# Patient Record
Sex: Female | Born: 1935 | Race: White | Hispanic: No | Smoking: Former smoker
Health system: Southern US, Community
[De-identification: ages and names within clinical notes are randomized; demographics above are authoritative.]

## PROBLEM LIST (undated history)

## (undated) DIAGNOSIS — F0280 Dementia in other diseases classified elsewhere without behavioral disturbance: Secondary | ICD-10-CM

## (undated) DIAGNOSIS — Z8719 Personal history of other diseases of the digestive system: Secondary | ICD-10-CM

## (undated) DIAGNOSIS — G309 Alzheimer's disease, unspecified: Secondary | ICD-10-CM

## (undated) DIAGNOSIS — M4807 Spinal stenosis, lumbosacral region: Secondary | ICD-10-CM

## (undated) DIAGNOSIS — E785 Hyperlipidemia, unspecified: Secondary | ICD-10-CM

## (undated) DIAGNOSIS — C50919 Malignant neoplasm of unspecified site of unspecified female breast: Secondary | ICD-10-CM

## (undated) DIAGNOSIS — E039 Hypothyroidism, unspecified: Secondary | ICD-10-CM

## (undated) DIAGNOSIS — G43909 Migraine, unspecified, not intractable, without status migrainosus: Secondary | ICD-10-CM

## (undated) DIAGNOSIS — F0390 Unspecified dementia without behavioral disturbance: Secondary | ICD-10-CM

## (undated) DIAGNOSIS — J449 Chronic obstructive pulmonary disease, unspecified: Secondary | ICD-10-CM

## (undated) DIAGNOSIS — I10 Essential (primary) hypertension: Secondary | ICD-10-CM

## (undated) DIAGNOSIS — Z8709 Personal history of other diseases of the respiratory system: Secondary | ICD-10-CM

## (undated) DIAGNOSIS — E559 Vitamin D deficiency, unspecified: Secondary | ICD-10-CM

## (undated) HISTORY — DX: Vitamin D deficiency, unspecified: E55.9

## (undated) HISTORY — DX: Spinal stenosis, lumbosacral region: M48.07

## (undated) HISTORY — DX: Alzheimer's disease, unspecified: G30.9

## (undated) HISTORY — DX: Essential (primary) hypertension: I10

## (undated) HISTORY — DX: Unspecified dementia, unspecified severity, without behavioral disturbance, psychotic disturbance, mood disturbance, and anxiety: F03.90

## (undated) HISTORY — DX: Personal history of other diseases of the respiratory system: Z87.09

## (undated) HISTORY — PX: MASTECTOMY: SHX3

## (undated) HISTORY — DX: Dementia in other diseases classified elsewhere, unspecified severity, without behavioral disturbance, psychotic disturbance, mood disturbance, and anxiety: F02.80

## (undated) HISTORY — PX: CAROTID ENDARTERECTOMY: SUR193

## (undated) HISTORY — DX: Hypothyroidism, unspecified: E03.9

## (undated) HISTORY — PX: CATARACT EXTRACTION: SUR2

## (undated) HISTORY — DX: Personal history of other diseases of the digestive system: Z87.19

## (undated) HISTORY — PX: BREAST LUMPECTOMY: SHX2

## (undated) HISTORY — DX: Migraine, unspecified, not intractable, without status migrainosus: G43.909

## (undated) HISTORY — PX: ABDOMINAL HYSTERECTOMY: SHX81

## (undated) HISTORY — PX: BACK SURGERY: SHX140

## (undated) HISTORY — DX: Hyperlipidemia, unspecified: E78.5

## (undated) HISTORY — DX: Malignant neoplasm of unspecified site of unspecified female breast: C50.919

---

## 1999-07-28 ENCOUNTER — Encounter: Admission: RE | Admit: 1999-07-28 | Discharge: 1999-10-26 | Payer: Self-pay | Admitting: Anesthesiology

## 2001-07-04 ENCOUNTER — Encounter: Admission: RE | Admit: 2001-07-04 | Discharge: 2001-07-04 | Payer: Self-pay | Admitting: Orthopedic Surgery

## 2001-07-04 ENCOUNTER — Encounter: Payer: Self-pay | Admitting: Orthopedic Surgery

## 2002-01-09 ENCOUNTER — Inpatient Hospital Stay (HOSPITAL_COMMUNITY): Admission: RE | Admit: 2002-01-09 | Discharge: 2002-01-14 | Payer: Self-pay | Admitting: Orthopedic Surgery

## 2002-01-09 ENCOUNTER — Encounter: Payer: Self-pay | Admitting: Orthopedic Surgery

## 2002-05-07 ENCOUNTER — Encounter: Admission: RE | Admit: 2002-05-07 | Discharge: 2002-05-07 | Payer: Self-pay | Admitting: Family Medicine

## 2002-05-07 ENCOUNTER — Encounter: Payer: Self-pay | Admitting: Family Medicine

## 2002-05-08 ENCOUNTER — Encounter: Payer: Self-pay | Admitting: Family Medicine

## 2002-05-08 ENCOUNTER — Encounter: Admission: RE | Admit: 2002-05-08 | Discharge: 2002-05-08 | Payer: Self-pay | Admitting: Family Medicine

## 2002-05-11 ENCOUNTER — Emergency Department (HOSPITAL_COMMUNITY): Admission: EM | Admit: 2002-05-11 | Discharge: 2002-05-11 | Payer: Self-pay | Admitting: Emergency Medicine

## 2002-05-11 ENCOUNTER — Encounter: Payer: Self-pay | Admitting: Emergency Medicine

## 2002-06-20 ENCOUNTER — Encounter: Admission: RE | Admit: 2002-06-20 | Discharge: 2002-06-20 | Payer: Self-pay | Admitting: Surgery

## 2002-06-20 ENCOUNTER — Encounter: Payer: Self-pay | Admitting: Surgery

## 2002-06-21 ENCOUNTER — Other Ambulatory Visit: Admission: RE | Admit: 2002-06-21 | Discharge: 2002-06-21 | Payer: Self-pay | Admitting: Diagnostic Radiology

## 2002-06-21 ENCOUNTER — Encounter: Payer: Self-pay | Admitting: Surgery

## 2002-06-21 ENCOUNTER — Encounter (INDEPENDENT_AMBULATORY_CARE_PROVIDER_SITE_OTHER): Payer: Self-pay | Admitting: *Deleted

## 2002-06-21 ENCOUNTER — Encounter: Admission: RE | Admit: 2002-06-21 | Discharge: 2002-06-21 | Payer: Self-pay | Admitting: Surgery

## 2002-07-15 ENCOUNTER — Encounter (INDEPENDENT_AMBULATORY_CARE_PROVIDER_SITE_OTHER): Payer: Self-pay | Admitting: Specialist

## 2002-07-15 ENCOUNTER — Inpatient Hospital Stay (HOSPITAL_COMMUNITY): Admission: RE | Admit: 2002-07-15 | Discharge: 2002-07-16 | Payer: Self-pay | Admitting: Surgery

## 2002-08-07 ENCOUNTER — Encounter: Payer: Self-pay | Admitting: Surgery

## 2002-08-07 ENCOUNTER — Ambulatory Visit (HOSPITAL_BASED_OUTPATIENT_CLINIC_OR_DEPARTMENT_OTHER): Admission: RE | Admit: 2002-08-07 | Discharge: 2002-08-07 | Payer: Self-pay | Admitting: Surgery

## 2002-12-04 ENCOUNTER — Ambulatory Visit (HOSPITAL_BASED_OUTPATIENT_CLINIC_OR_DEPARTMENT_OTHER): Admission: RE | Admit: 2002-12-04 | Discharge: 2002-12-04 | Payer: Self-pay | Admitting: Surgery

## 2003-01-09 ENCOUNTER — Encounter: Payer: Self-pay | Admitting: Family Medicine

## 2003-01-09 ENCOUNTER — Encounter: Admission: RE | Admit: 2003-01-09 | Discharge: 2003-01-09 | Payer: Self-pay | Admitting: Family Medicine

## 2003-01-31 ENCOUNTER — Encounter: Admission: RE | Admit: 2003-01-31 | Discharge: 2003-03-25 | Payer: Self-pay | Admitting: Surgery

## 2003-04-15 ENCOUNTER — Ambulatory Visit (HOSPITAL_COMMUNITY): Admission: RE | Admit: 2003-04-15 | Discharge: 2003-04-15 | Payer: Self-pay | Admitting: Hematology & Oncology

## 2003-04-15 ENCOUNTER — Encounter: Payer: Self-pay | Admitting: Hematology & Oncology

## 2003-09-28 ENCOUNTER — Emergency Department (HOSPITAL_COMMUNITY): Admission: EM | Admit: 2003-09-28 | Discharge: 2003-09-28 | Payer: Self-pay | Admitting: Emergency Medicine

## 2004-01-16 ENCOUNTER — Emergency Department (HOSPITAL_COMMUNITY): Admission: EM | Admit: 2004-01-16 | Discharge: 2004-01-16 | Payer: Self-pay | Admitting: Emergency Medicine

## 2004-01-29 ENCOUNTER — Encounter: Admission: RE | Admit: 2004-01-29 | Discharge: 2004-01-29 | Payer: Self-pay | Admitting: Orthopedic Surgery

## 2004-09-21 ENCOUNTER — Ambulatory Visit: Payer: Self-pay | Admitting: Family Medicine

## 2004-10-12 ENCOUNTER — Ambulatory Visit: Payer: Self-pay | Admitting: Hematology & Oncology

## 2004-10-13 ENCOUNTER — Ambulatory Visit (HOSPITAL_COMMUNITY): Admission: RE | Admit: 2004-10-13 | Discharge: 2004-10-13 | Payer: Self-pay | Admitting: Hematology & Oncology

## 2004-11-01 ENCOUNTER — Ambulatory Visit: Payer: Self-pay | Admitting: Internal Medicine

## 2004-11-04 ENCOUNTER — Ambulatory Visit: Payer: Self-pay | Admitting: Family Medicine

## 2004-11-16 ENCOUNTER — Ambulatory Visit: Payer: Self-pay | Admitting: Family Medicine

## 2004-11-29 ENCOUNTER — Ambulatory Visit (HOSPITAL_COMMUNITY): Admission: RE | Admit: 2004-11-29 | Discharge: 2004-11-29 | Payer: Self-pay | Admitting: Family Medicine

## 2004-12-08 ENCOUNTER — Ambulatory Visit: Payer: Self-pay | Admitting: Family Medicine

## 2004-12-09 ENCOUNTER — Encounter: Admission: RE | Admit: 2004-12-09 | Discharge: 2004-12-09 | Payer: Self-pay | Admitting: Family Medicine

## 2004-12-23 ENCOUNTER — Ambulatory Visit: Payer: Self-pay | Admitting: Family Medicine

## 2005-01-11 ENCOUNTER — Ambulatory Visit: Payer: Self-pay | Admitting: Hematology & Oncology

## 2005-01-19 ENCOUNTER — Ambulatory Visit: Payer: Self-pay | Admitting: Family Medicine

## 2005-02-07 ENCOUNTER — Encounter: Admission: RE | Admit: 2005-02-07 | Discharge: 2005-02-07 | Payer: Self-pay | Admitting: Orthopedic Surgery

## 2005-04-15 ENCOUNTER — Ambulatory Visit: Payer: Self-pay | Admitting: Internal Medicine

## 2005-05-04 ENCOUNTER — Ambulatory Visit: Payer: Self-pay | Admitting: Internal Medicine

## 2005-05-09 ENCOUNTER — Ambulatory Visit: Payer: Self-pay | Admitting: Hematology & Oncology

## 2005-07-29 ENCOUNTER — Ambulatory Visit: Payer: Self-pay | Admitting: Family Medicine

## 2005-08-23 ENCOUNTER — Ambulatory Visit: Payer: Self-pay | Admitting: Family Medicine

## 2005-08-29 ENCOUNTER — Ambulatory Visit: Payer: Self-pay | Admitting: Family Medicine

## 2005-09-21 ENCOUNTER — Ambulatory Visit: Payer: Self-pay | Admitting: Cardiology

## 2005-09-26 ENCOUNTER — Ambulatory Visit: Payer: Self-pay

## 2005-10-13 ENCOUNTER — Ambulatory Visit: Payer: Self-pay | Admitting: Internal Medicine

## 2005-10-13 ENCOUNTER — Inpatient Hospital Stay (HOSPITAL_COMMUNITY): Admission: EM | Admit: 2005-10-13 | Discharge: 2005-10-15 | Payer: Self-pay | Admitting: Emergency Medicine

## 2005-10-14 ENCOUNTER — Ambulatory Visit: Payer: Self-pay | Admitting: Internal Medicine

## 2005-11-08 ENCOUNTER — Ambulatory Visit: Payer: Self-pay | Admitting: Internal Medicine

## 2005-11-18 ENCOUNTER — Ambulatory Visit: Payer: Self-pay | Admitting: Hematology & Oncology

## 2005-11-28 ENCOUNTER — Encounter: Admission: RE | Admit: 2005-11-28 | Discharge: 2005-11-28 | Payer: Self-pay | Admitting: Internal Medicine

## 2005-12-21 ENCOUNTER — Ambulatory Visit: Payer: Self-pay | Admitting: Internal Medicine

## 2006-01-03 ENCOUNTER — Ambulatory Visit: Payer: Self-pay | Admitting: Family Medicine

## 2006-01-13 ENCOUNTER — Ambulatory Visit: Payer: Self-pay | Admitting: Family Medicine

## 2006-01-19 ENCOUNTER — Ambulatory Visit: Payer: Self-pay | Admitting: Family Medicine

## 2006-01-26 ENCOUNTER — Ambulatory Visit: Payer: Self-pay | Admitting: Family Medicine

## 2006-03-07 ENCOUNTER — Ambulatory Visit: Payer: Self-pay | Admitting: Family Medicine

## 2006-04-07 ENCOUNTER — Ambulatory Visit: Payer: Self-pay | Admitting: Family Medicine

## 2006-05-16 ENCOUNTER — Ambulatory Visit: Payer: Self-pay | Admitting: Hematology & Oncology

## 2006-05-22 LAB — CBC WITH DIFFERENTIAL/PLATELET
Basophils Absolute: 0 10*3/uL (ref 0.0–0.1)
Eosinophils Absolute: 0 10*3/uL (ref 0.0–0.5)
HCT: 39.9 % (ref 34.8–46.6)
HGB: 13.7 g/dL (ref 11.6–15.9)
MCH: 29.4 pg (ref 26.0–34.0)
MONO#: 0.7 10*3/uL (ref 0.1–0.9)
NEUT#: 3.3 10*3/uL (ref 1.5–6.5)
NEUT%: 63 % (ref 39.6–76.8)
RDW: 13.2 % (ref 11.3–14.5)
lymph#: 1.2 10*3/uL (ref 0.9–3.3)

## 2006-05-22 LAB — COMPREHENSIVE METABOLIC PANEL
AST: 22 U/L (ref 0–37)
Albumin: 4.6 g/dL (ref 3.5–5.2)
BUN: 15 mg/dL (ref 6–23)
CO2: 27 mEq/L (ref 19–32)
Calcium: 9.7 mg/dL (ref 8.4–10.5)
Chloride: 104 mEq/L (ref 96–112)
Creatinine, Ser: 0.71 mg/dL (ref 0.40–1.20)
Glucose, Bld: 93 mg/dL (ref 70–99)
Potassium: 3.8 mEq/L (ref 3.5–5.3)

## 2006-09-07 ENCOUNTER — Ambulatory Visit: Payer: Self-pay | Admitting: Cardiology

## 2006-09-30 ENCOUNTER — Encounter: Admission: RE | Admit: 2006-09-30 | Discharge: 2006-09-30 | Payer: Self-pay | Admitting: Neurology

## 2006-11-09 ENCOUNTER — Ambulatory Visit: Payer: Self-pay | Admitting: Family Medicine

## 2006-11-15 ENCOUNTER — Ambulatory Visit: Payer: Self-pay | Admitting: Family Medicine

## 2006-11-15 LAB — CONVERTED CEMR LAB
AST: 30 units/L (ref 0–37)
Calcium: 9.9 mg/dL (ref 8.4–10.5)
Chloride: 104 meq/L (ref 96–112)
Creatinine, Ser: 1 mg/dL (ref 0.4–1.2)
Free T4: 1 ng/dL (ref 0.6–1.6)
Glomerular Filtration Rate, Af Am: 69 mL/min/{1.73_m2}
Potassium: 4.3 meq/L (ref 3.5–5.1)
Sodium: 142 meq/L (ref 135–145)
T3, Free: 2.5 pg/mL (ref 2.3–4.2)
TSH: 4.91 microintl units/mL (ref 0.35–5.50)

## 2006-11-17 ENCOUNTER — Ambulatory Visit: Payer: Self-pay | Admitting: Hematology & Oncology

## 2006-11-22 LAB — COMPREHENSIVE METABOLIC PANEL
ALT: 13 U/L (ref 0–35)
AST: 17 U/L (ref 0–37)
CO2: 25 mEq/L (ref 19–32)
Calcium: 9.5 mg/dL (ref 8.4–10.5)
Chloride: 108 mEq/L (ref 96–112)
Creatinine, Ser: 0.75 mg/dL (ref 0.40–1.20)
Potassium: 3.8 mEq/L (ref 3.5–5.3)
Sodium: 142 mEq/L (ref 135–145)
Total Protein: 6.5 g/dL (ref 6.0–8.3)

## 2006-12-26 DIAGNOSIS — E039 Hypothyroidism, unspecified: Secondary | ICD-10-CM | POA: Insufficient documentation

## 2006-12-26 DIAGNOSIS — J4489 Other specified chronic obstructive pulmonary disease: Secondary | ICD-10-CM | POA: Insufficient documentation

## 2006-12-26 DIAGNOSIS — J45909 Unspecified asthma, uncomplicated: Secondary | ICD-10-CM | POA: Insufficient documentation

## 2006-12-26 DIAGNOSIS — E785 Hyperlipidemia, unspecified: Secondary | ICD-10-CM

## 2006-12-26 DIAGNOSIS — I10 Essential (primary) hypertension: Secondary | ICD-10-CM | POA: Insufficient documentation

## 2006-12-26 DIAGNOSIS — Z853 Personal history of malignant neoplasm of breast: Secondary | ICD-10-CM

## 2006-12-26 DIAGNOSIS — M545 Low back pain: Secondary | ICD-10-CM

## 2006-12-26 DIAGNOSIS — J449 Chronic obstructive pulmonary disease, unspecified: Secondary | ICD-10-CM

## 2007-01-23 ENCOUNTER — Ambulatory Visit: Payer: Self-pay | Admitting: Family Medicine

## 2007-01-23 LAB — CONVERTED CEMR LAB
ALT: 17 units/L (ref 0–40)
AST: 25 units/L (ref 0–37)
Albumin: 4.1 g/dL (ref 3.5–5.2)
Alkaline Phosphatase: 47 units/L (ref 39–117)
BUN: 12 mg/dL (ref 6–23)
Basophils Absolute: 0 10*3/uL (ref 0.0–0.1)
Basophils Relative: 0.7 % (ref 0.0–1.0)
CO2: 26 meq/L (ref 19–32)
Calcium: 9.5 mg/dL (ref 8.4–10.5)
Chloride: 106 meq/L (ref 96–112)
Creatinine, Ser: 0.7 mg/dL (ref 0.4–1.2)
Hemoglobin: 14.8 g/dL (ref 12.0–15.0)
MCHC: 34.6 g/dL (ref 30.0–36.0)
Monocytes Absolute: 0.6 10*3/uL (ref 0.2–0.7)
Monocytes Relative: 12.7 % — ABNORMAL HIGH (ref 3.0–11.0)
Platelets: 231 10*3/uL (ref 150–400)
Potassium: 4 meq/L (ref 3.5–5.1)
RBC: 4.87 M/uL (ref 3.87–5.11)
RDW: 11.9 % (ref 11.5–14.6)
Total Bilirubin: 0.6 mg/dL (ref 0.3–1.2)
Total Protein: 6.3 g/dL (ref 6.0–8.3)

## 2007-04-25 ENCOUNTER — Encounter: Payer: Self-pay | Admitting: Family Medicine

## 2007-05-21 ENCOUNTER — Ambulatory Visit: Payer: Self-pay | Admitting: Hematology & Oncology

## 2007-05-24 ENCOUNTER — Encounter: Payer: Self-pay | Admitting: Family Medicine

## 2007-05-24 DIAGNOSIS — F068 Other specified mental disorders due to known physiological condition: Secondary | ICD-10-CM

## 2007-06-10 ENCOUNTER — Encounter: Payer: Self-pay | Admitting: Family Medicine

## 2007-07-06 ENCOUNTER — Ambulatory Visit: Payer: Self-pay | Admitting: Hematology & Oncology

## 2007-07-11 ENCOUNTER — Encounter: Payer: Self-pay | Admitting: Family Medicine

## 2007-07-11 LAB — CBC WITH DIFFERENTIAL/PLATELET
BASO%: 0.3 % (ref 0.0–2.0)
Basophils Absolute: 0 10*3/uL (ref 0.0–0.1)
EOS%: 0.8 % (ref 0.0–7.0)
HGB: 14.5 g/dL (ref 11.6–15.9)
MCH: 30.5 pg (ref 26.0–34.0)
MCHC: 35.3 g/dL (ref 32.0–36.0)
MCV: 86.3 fL (ref 81.0–101.0)
MONO%: 10.1 % (ref 0.0–13.0)
NEUT%: 65.9 % (ref 39.6–76.8)
RDW: 13.1 % (ref 11.3–14.5)

## 2007-07-11 LAB — COMPREHENSIVE METABOLIC PANEL
ALT: 15 U/L (ref 0–35)
AST: 19 U/L (ref 0–37)
Alkaline Phosphatase: 49 U/L (ref 39–117)
BUN: 16 mg/dL (ref 6–23)
Creatinine, Ser: 0.76 mg/dL (ref 0.40–1.20)

## 2007-07-27 ENCOUNTER — Ambulatory Visit: Payer: Self-pay | Admitting: Family Medicine

## 2007-07-27 DIAGNOSIS — R05 Cough: Secondary | ICD-10-CM

## 2007-07-30 ENCOUNTER — Ambulatory Visit: Payer: Self-pay | Admitting: Internal Medicine

## 2007-07-30 ENCOUNTER — Telehealth (INDEPENDENT_AMBULATORY_CARE_PROVIDER_SITE_OTHER): Payer: Self-pay | Admitting: *Deleted

## 2007-07-30 DIAGNOSIS — J4489 Other specified chronic obstructive pulmonary disease: Secondary | ICD-10-CM | POA: Insufficient documentation

## 2007-07-30 DIAGNOSIS — J449 Chronic obstructive pulmonary disease, unspecified: Secondary | ICD-10-CM

## 2007-11-12 ENCOUNTER — Telehealth (INDEPENDENT_AMBULATORY_CARE_PROVIDER_SITE_OTHER): Payer: Self-pay | Admitting: *Deleted

## 2007-11-15 ENCOUNTER — Ambulatory Visit: Payer: Self-pay | Admitting: Family Medicine

## 2007-11-28 ENCOUNTER — Telehealth (INDEPENDENT_AMBULATORY_CARE_PROVIDER_SITE_OTHER): Payer: Self-pay | Admitting: *Deleted

## 2007-12-06 ENCOUNTER — Telehealth (INDEPENDENT_AMBULATORY_CARE_PROVIDER_SITE_OTHER): Payer: Self-pay | Admitting: *Deleted

## 2007-12-18 ENCOUNTER — Telehealth (INDEPENDENT_AMBULATORY_CARE_PROVIDER_SITE_OTHER): Payer: Self-pay | Admitting: *Deleted

## 2007-12-24 ENCOUNTER — Ambulatory Visit: Payer: Self-pay | Admitting: Family Medicine

## 2007-12-24 DIAGNOSIS — B354 Tinea corporis: Secondary | ICD-10-CM | POA: Insufficient documentation

## 2007-12-26 ENCOUNTER — Ambulatory Visit: Payer: Self-pay | Admitting: Family Medicine

## 2008-01-07 ENCOUNTER — Ambulatory Visit: Payer: Self-pay | Admitting: Hematology & Oncology

## 2008-01-08 LAB — CONVERTED CEMR LAB
Alkaline Phosphatase: 42 units/L (ref 39–117)
BUN: 10 mg/dL (ref 6–23)
Bilirubin, Direct: 0.1 mg/dL (ref 0.0–0.3)
CO2: 31 meq/L (ref 19–32)
Creatinine, Ser: 0.7 mg/dL (ref 0.4–1.2)
GFR calc Af Amer: 104 mL/min
Glucose, Bld: 88 mg/dL (ref 70–99)
Potassium: 3.9 meq/L (ref 3.5–5.1)
Total Bilirubin: 0.7 mg/dL (ref 0.3–1.2)
Total Protein: 5.9 g/dL — ABNORMAL LOW (ref 6.0–8.3)

## 2008-01-09 ENCOUNTER — Encounter: Payer: Self-pay | Admitting: Family Medicine

## 2008-01-09 LAB — COMPREHENSIVE METABOLIC PANEL
AST: 22 U/L (ref 0–37)
Albumin: 4.5 g/dL (ref 3.5–5.2)
BUN: 16 mg/dL (ref 6–23)
Calcium: 9.6 mg/dL (ref 8.4–10.5)
Chloride: 104 mEq/L (ref 96–112)
Creatinine, Ser: 0.78 mg/dL (ref 0.40–1.20)
Glucose, Bld: 87 mg/dL (ref 70–99)
Potassium: 4.4 mEq/L (ref 3.5–5.3)

## 2008-01-09 LAB — CBC WITH DIFFERENTIAL/PLATELET
Basophils Absolute: 0 10*3/uL (ref 0.0–0.1)
EOS%: 0.9 % (ref 0.0–7.0)
Eosinophils Absolute: 0.1 10*3/uL (ref 0.0–0.5)
HCT: 41.9 % (ref 34.8–46.6)
HGB: 14.3 g/dL (ref 11.6–15.9)
MCH: 29.6 pg (ref 26.0–34.0)
MCV: 86.4 fL (ref 81.0–101.0)
MONO%: 9.1 % (ref 0.0–13.0)
NEUT#: 4.9 10*3/uL (ref 1.5–6.5)
NEUT%: 67.6 % (ref 39.6–76.8)
lymph#: 1.6 10*3/uL (ref 0.9–3.3)

## 2008-02-07 ENCOUNTER — Telehealth (INDEPENDENT_AMBULATORY_CARE_PROVIDER_SITE_OTHER): Payer: Self-pay | Admitting: *Deleted

## 2008-04-08 ENCOUNTER — Ambulatory Visit: Payer: Self-pay | Admitting: Internal Medicine

## 2008-04-08 ENCOUNTER — Telehealth: Payer: Self-pay | Admitting: Internal Medicine

## 2008-04-08 DIAGNOSIS — J441 Chronic obstructive pulmonary disease with (acute) exacerbation: Secondary | ICD-10-CM

## 2008-04-09 ENCOUNTER — Encounter: Payer: Self-pay | Admitting: Family Medicine

## 2008-06-30 ENCOUNTER — Ambulatory Visit: Payer: Self-pay | Admitting: Family Medicine

## 2008-07-22 ENCOUNTER — Ambulatory Visit: Payer: Self-pay | Admitting: Family Medicine

## 2008-07-23 LAB — CONVERTED CEMR LAB
ALT: 20 units/L (ref 0–35)
AST: 27 units/L (ref 0–37)
Alkaline Phosphatase: 44 units/L (ref 39–117)
BUN: 15 mg/dL (ref 6–23)
CO2: 32 meq/L (ref 19–32)
Chloride: 111 meq/L (ref 96–112)
Cholesterol: 133 mg/dL (ref 0–200)
GFR calc non Af Amer: 103 mL/min
LDL Cholesterol: 60 mg/dL (ref 0–99)
Potassium: 4.3 meq/L (ref 3.5–5.1)
Total Bilirubin: 0.8 mg/dL (ref 0.3–1.2)
Total CHOL/HDL Ratio: 2.3

## 2008-07-24 ENCOUNTER — Encounter (INDEPENDENT_AMBULATORY_CARE_PROVIDER_SITE_OTHER): Payer: Self-pay | Admitting: *Deleted

## 2008-08-12 ENCOUNTER — Ambulatory Visit: Payer: Self-pay | Admitting: Hematology & Oncology

## 2008-08-13 ENCOUNTER — Encounter: Payer: Self-pay | Admitting: Family Medicine

## 2008-08-13 LAB — COMPREHENSIVE METABOLIC PANEL
AST: 22 U/L (ref 0–37)
Alkaline Phosphatase: 46 U/L (ref 39–117)
BUN: 17 mg/dL (ref 6–23)
Creatinine, Ser: 0.77 mg/dL (ref 0.40–1.20)
Glucose, Bld: 110 mg/dL — ABNORMAL HIGH (ref 70–99)
Total Bilirubin: 0.5 mg/dL (ref 0.3–1.2)

## 2008-08-13 LAB — CBC WITH DIFFERENTIAL (CANCER CENTER ONLY)
BASO#: 0 10*3/uL (ref 0.0–0.2)
Eosinophils Absolute: 0.1 10*3/uL (ref 0.0–0.5)
HCT: 38.5 % (ref 34.8–46.6)
HGB: 13.3 g/dL (ref 11.6–15.9)
LYMPH%: 29.2 % (ref 14.0–48.0)
MCH: 29.5 pg (ref 26.0–34.0)
MCV: 85 fL (ref 81–101)
MONO%: 9.8 % (ref 0.0–13.0)
NEUT%: 58.6 % (ref 39.6–80.0)
RBC: 4.51 10*6/uL (ref 3.70–5.32)

## 2008-09-14 ENCOUNTER — Inpatient Hospital Stay (HOSPITAL_COMMUNITY): Admission: EM | Admit: 2008-09-14 | Discharge: 2008-09-15 | Payer: Self-pay | Admitting: Emergency Medicine

## 2008-09-14 ENCOUNTER — Ambulatory Visit: Payer: Self-pay | Admitting: Internal Medicine

## 2008-09-15 ENCOUNTER — Encounter: Payer: Self-pay | Admitting: Family Medicine

## 2008-10-21 ENCOUNTER — Ambulatory Visit: Payer: Self-pay | Admitting: Family Medicine

## 2008-10-21 DIAGNOSIS — H6120 Impacted cerumen, unspecified ear: Secondary | ICD-10-CM

## 2008-10-21 DIAGNOSIS — R42 Dizziness and giddiness: Secondary | ICD-10-CM

## 2008-10-22 ENCOUNTER — Encounter: Payer: Self-pay | Admitting: Internal Medicine

## 2008-10-23 ENCOUNTER — Ambulatory Visit: Payer: Self-pay | Admitting: Internal Medicine

## 2008-10-24 ENCOUNTER — Encounter: Payer: Self-pay | Admitting: Family Medicine

## 2008-10-25 LAB — CONVERTED CEMR LAB
Basophils Absolute: 0.1 10*3/uL (ref 0.0–0.1)
CO2: 31 meq/L (ref 19–32)
Calcium: 9.5 mg/dL (ref 8.4–10.5)
Creatinine, Ser: 0.7 mg/dL (ref 0.4–1.2)
Eosinophils Absolute: 0.1 10*3/uL (ref 0.0–0.7)
Folate: 19.1 ng/mL
GFR calc Af Amer: 104 mL/min
GFR calc non Af Amer: 86 mL/min
HCT: 40.9 % (ref 36.0–46.0)
MCHC: 34.4 g/dL (ref 30.0–36.0)
MCV: 88.8 fL (ref 78.0–100.0)
Monocytes Absolute: 0.5 10*3/uL (ref 0.1–1.0)
Platelets: 233 10*3/uL (ref 150–400)
RDW: 12.1 % (ref 11.5–14.6)
TSH: 0.89 microintl units/mL (ref 0.35–5.50)
Vitamin B-12: 481 pg/mL (ref 211–911)

## 2008-10-27 ENCOUNTER — Encounter (INDEPENDENT_AMBULATORY_CARE_PROVIDER_SITE_OTHER): Payer: Self-pay | Admitting: *Deleted

## 2008-12-05 ENCOUNTER — Telehealth (INDEPENDENT_AMBULATORY_CARE_PROVIDER_SITE_OTHER): Payer: Self-pay | Admitting: *Deleted

## 2008-12-05 ENCOUNTER — Ambulatory Visit: Payer: Self-pay | Admitting: Family Medicine

## 2009-02-17 ENCOUNTER — Ambulatory Visit: Payer: Self-pay | Admitting: Hematology & Oncology

## 2009-03-12 ENCOUNTER — Telehealth (INDEPENDENT_AMBULATORY_CARE_PROVIDER_SITE_OTHER): Payer: Self-pay | Admitting: *Deleted

## 2009-03-20 ENCOUNTER — Ambulatory Visit: Payer: Self-pay | Admitting: Family Medicine

## 2009-03-30 ENCOUNTER — Encounter (INDEPENDENT_AMBULATORY_CARE_PROVIDER_SITE_OTHER): Payer: Self-pay | Admitting: *Deleted

## 2009-03-30 LAB — CONVERTED CEMR LAB
AST: 20 units/L (ref 0–37)
Albumin: 4.6 g/dL (ref 3.5–5.2)
Alkaline Phosphatase: 50 units/L (ref 39–117)
CO2: 28 meq/L (ref 19–32)
Calcium: 10 mg/dL (ref 8.4–10.5)
Cholesterol: 172 mg/dL (ref 0–200)
Creatinine, Ser: 0.76 mg/dL (ref 0.40–1.20)
HDL: 53 mg/dL (ref >39)
Total Bilirubin: 0.3 mg/dL (ref 0.3–1.2)

## 2009-07-09 ENCOUNTER — Ambulatory Visit: Payer: Self-pay | Admitting: Family Medicine

## 2009-10-05 ENCOUNTER — Ambulatory Visit: Payer: Self-pay | Admitting: Family Medicine

## 2009-11-09 ENCOUNTER — Telehealth (INDEPENDENT_AMBULATORY_CARE_PROVIDER_SITE_OTHER): Payer: Self-pay | Admitting: *Deleted

## 2009-11-10 ENCOUNTER — Telehealth (INDEPENDENT_AMBULATORY_CARE_PROVIDER_SITE_OTHER): Payer: Self-pay | Admitting: *Deleted

## 2009-11-10 ENCOUNTER — Encounter: Payer: Self-pay | Admitting: Family Medicine

## 2009-11-16 ENCOUNTER — Telehealth (INDEPENDENT_AMBULATORY_CARE_PROVIDER_SITE_OTHER): Payer: Self-pay | Admitting: *Deleted

## 2009-11-23 ENCOUNTER — Ambulatory Visit: Payer: Self-pay | Admitting: Family Medicine

## 2009-12-18 ENCOUNTER — Telehealth (INDEPENDENT_AMBULATORY_CARE_PROVIDER_SITE_OTHER): Payer: Self-pay | Admitting: *Deleted

## 2009-12-21 ENCOUNTER — Telehealth (INDEPENDENT_AMBULATORY_CARE_PROVIDER_SITE_OTHER): Payer: Self-pay | Admitting: *Deleted

## 2009-12-29 ENCOUNTER — Telehealth (INDEPENDENT_AMBULATORY_CARE_PROVIDER_SITE_OTHER): Payer: Self-pay | Admitting: *Deleted

## 2010-01-05 ENCOUNTER — Ambulatory Visit: Payer: Self-pay | Admitting: Family Medicine

## 2010-01-05 DIAGNOSIS — R5381 Other malaise: Secondary | ICD-10-CM

## 2010-01-05 DIAGNOSIS — R5383 Other fatigue: Secondary | ICD-10-CM | POA: Insufficient documentation

## 2010-01-05 LAB — CONVERTED CEMR LAB
Blood Glucose, Fingerstick: 85
Hemoglobin: 10.9 g/dL

## 2010-01-08 ENCOUNTER — Ambulatory Visit: Payer: Self-pay | Admitting: Family Medicine

## 2010-01-11 ENCOUNTER — Telehealth (INDEPENDENT_AMBULATORY_CARE_PROVIDER_SITE_OTHER): Payer: Self-pay | Admitting: *Deleted

## 2010-01-11 LAB — CONVERTED CEMR LAB
ALT: 20 units/L (ref 0–35)
Basophils Relative: 0 % (ref 0.0–3.0)
Bilirubin Urine: NEGATIVE
Bilirubin, Direct: 0.2 mg/dL (ref 0.0–0.3)
Cholesterol: 158 mg/dL (ref 0–200)
Ferritin: 37.2 ng/mL (ref 10.0–291.0)
GFR calc non Af Amer: 85.55 mL/min (ref >60)
HDL: 47.9 mg/dL (ref >39.00)
Hemoglobin, Urine: NEGATIVE
Hemoglobin: 13.3 g/dL (ref 12.0–15.0)
Lymphocytes Relative: 18.3 % (ref 12.0–46.0)
MCHC: 32.9 g/dL (ref 30.0–36.0)
Monocytes Relative: 7.1 % (ref 3.0–12.0)
Neutro Abs: 4.8 10*3/uL (ref 1.4–7.7)
Potassium: 4 meq/L (ref 3.5–5.1)
RBC: 4.46 M/uL (ref 3.87–5.11)
Saturation Ratios: 24.5 % (ref 20.0–50.0)
Sodium: 142 meq/L (ref 135–145)
Total Protein: 6.6 g/dL (ref 6.0–8.3)
Urine Glucose: NEGATIVE mg/dL
Urobilinogen, UA: 0.2 (ref 0.0–1.0)
VLDL: 22.8 mg/dL (ref 0.0–40.0)
Vitamin B-12: 620 pg/mL (ref 211–911)

## 2010-01-29 IMAGING — CR DG CHEST 1V PORT
1 series · 1 of 1 positions shown · non-contrast
Comparison: 04/08/2008

CLINICAL DATA: Abdominal pain and chest pain

PORTABLE CHEST - 1 VIEW

[view not recorded]
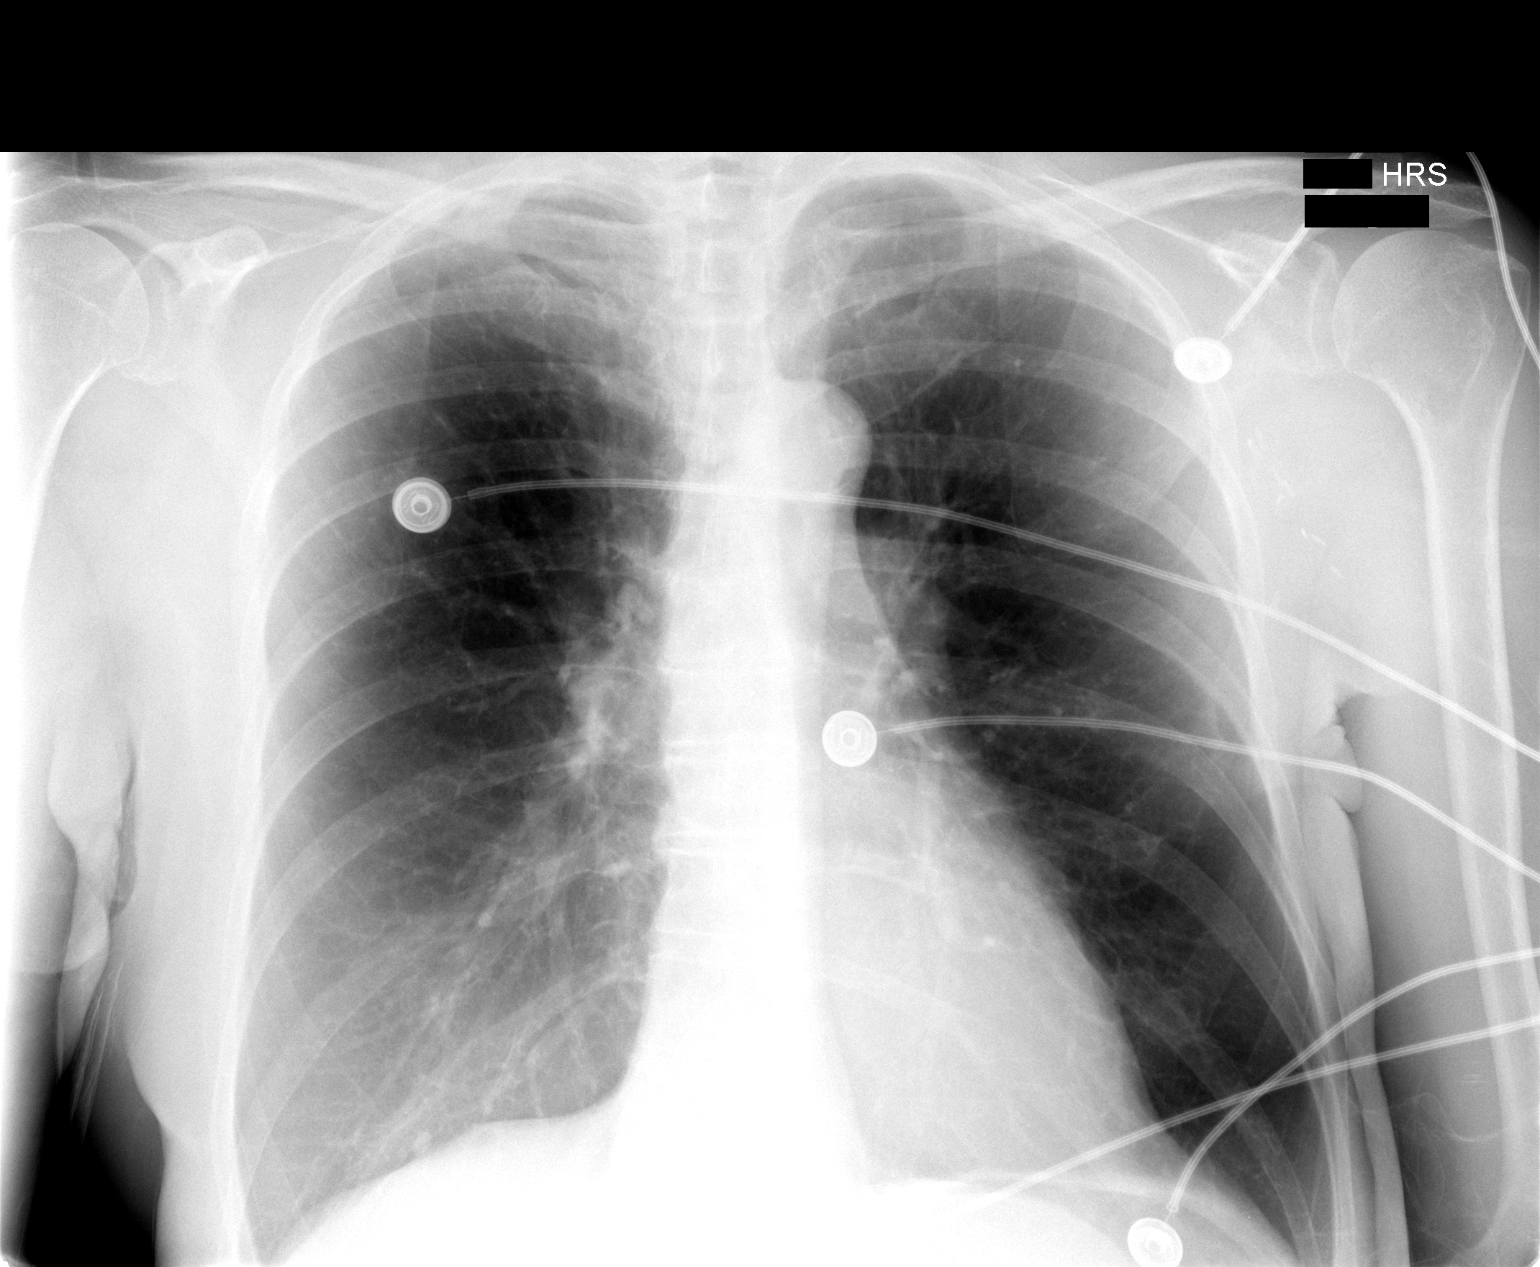

[1 of 1 positions shown; findings below may reference images not displayed]

FINDINGS: Stable COPD.  Normal heart size.  No edema or
infiltrates.  No pleural fluid identified.  The patient has had
prior left mastectomy.
IMPRESSION: Stable COPD.  No acute findings in the chest.

## 2010-01-30 IMAGING — NM NM LIVER FUNCTION STUDY
1 series · 6 of 6 positions shown · non-contrast
Comparison: CT dated 09/14/2008.

CLINICAL DATA: Abdominal pain.Cholelithiasis.

NUCLEAR MEDICINE HEPATOBILIARY IMAGING
TECHNIQUE: Sequential images of the abdomen were obtained [DATE] minutes following intravenous administration of
radiopharmaceutical.
Radiopharmaceutical: 5.4 mCi technetium 99m Choletec

[hida · 2.40mm/px · 6 of 58 frames shown]
[frame 5/58]
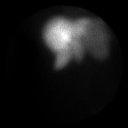
[frame 15/58]
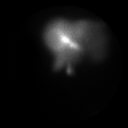
[frame 25/58]
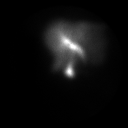
[frame 34/58]
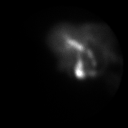
[frame 44/58]
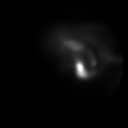
[frame 54/58]
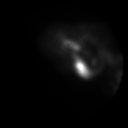

[6 of 6 positions shown; findings below may reference images not displayed]

FINDINGS: Normal visualization of the liver, bile ducts and small
bowel.
IMPRESSION: Normal examination.

## 2010-02-02 ENCOUNTER — Encounter: Payer: Self-pay | Admitting: Family Medicine

## 2010-03-31 ENCOUNTER — Encounter (INDEPENDENT_AMBULATORY_CARE_PROVIDER_SITE_OTHER): Payer: Self-pay | Admitting: *Deleted

## 2010-04-02 ENCOUNTER — Ambulatory Visit: Payer: Self-pay | Admitting: Family Medicine

## 2010-04-02 LAB — CONVERTED CEMR LAB
Blood in Urine, dipstick: NEGATIVE
Glucose, Urine, Semiquant: NEGATIVE
Ketones, urine, test strip: NEGATIVE
Protein, U semiquant: 30
WBC Urine, dipstick: NEGATIVE
pH: 6

## 2010-04-08 ENCOUNTER — Telehealth (INDEPENDENT_AMBULATORY_CARE_PROVIDER_SITE_OTHER): Payer: Self-pay | Admitting: *Deleted

## 2010-04-08 LAB — CONVERTED CEMR LAB
ALT: 20 units/L (ref 0–35)
AST: 29 units/L (ref 0–37)
Albumin: 4.6 g/dL (ref 3.5–5.2)
Alkaline Phosphatase: 50 units/L (ref 39–117)
Basophils Relative: 0.2 % (ref 0.0–3.0)
Calcium: 9.8 mg/dL (ref 8.4–10.5)
Eosinophils Relative: 0.5 % (ref 0.0–5.0)
Folate: >20 ng/mL
GFR calc non Af Amer: 82.76 mL/min (ref >60)
Glucose, Bld: 97 mg/dL (ref 70–99)
HCT: 46.6 % — ABNORMAL HIGH (ref 36.0–46.0)
Hemoglobin: 15.8 g/dL — ABNORMAL HIGH (ref 12.0–15.0)
Lymphocytes Relative: 22.2 % (ref 12.0–46.0)
Lymphs Abs: 1.8 10*3/uL (ref 0.7–4.0)
Monocytes Relative: 8.7 % (ref 3.0–12.0)
Neutro Abs: 5.5 10*3/uL (ref 1.4–7.7)
Potassium: 3.8 meq/L (ref 3.5–5.1)
RBC: 5.24 M/uL — ABNORMAL HIGH (ref 3.87–5.11)
Sodium: 144 meq/L (ref 135–145)
TSH: 2.13 microintl units/mL (ref 0.35–5.50)
Vitamin B-12: 307 pg/mL (ref 211–911)
WBC: 8.1 10*3/uL (ref 4.5–10.5)

## 2010-07-28 ENCOUNTER — Encounter: Payer: Self-pay | Admitting: Family Medicine

## 2010-09-28 ENCOUNTER — Telehealth (INDEPENDENT_AMBULATORY_CARE_PROVIDER_SITE_OTHER): Payer: Self-pay | Admitting: *Deleted

## 2010-10-22 ENCOUNTER — Encounter: Payer: Self-pay | Admitting: Family Medicine

## 2010-10-25 ENCOUNTER — Encounter: Payer: Self-pay | Admitting: Family Medicine

## 2010-10-25 ENCOUNTER — Ambulatory Visit: Payer: Self-pay | Admitting: Family Medicine

## 2010-10-25 DIAGNOSIS — E559 Vitamin D deficiency, unspecified: Secondary | ICD-10-CM | POA: Insufficient documentation

## 2010-11-02 ENCOUNTER — Telehealth (INDEPENDENT_AMBULATORY_CARE_PROVIDER_SITE_OTHER): Payer: Self-pay | Admitting: *Deleted

## 2010-11-04 ENCOUNTER — Ambulatory Visit
Admission: RE | Admit: 2010-11-04 | Discharge: 2010-11-04 | Payer: Self-pay | Source: Home / Self Care | Attending: Internal Medicine | Admitting: Internal Medicine

## 2010-11-04 ENCOUNTER — Telehealth (INDEPENDENT_AMBULATORY_CARE_PROVIDER_SITE_OTHER): Payer: Self-pay | Admitting: *Deleted

## 2010-11-04 DIAGNOSIS — J209 Acute bronchitis, unspecified: Secondary | ICD-10-CM

## 2010-11-05 ENCOUNTER — Encounter: Payer: Self-pay | Admitting: Family Medicine

## 2010-11-09 ENCOUNTER — Ambulatory Visit
Admission: RE | Admit: 2010-11-09 | Discharge: 2010-11-09 | Payer: Self-pay | Source: Home / Self Care | Attending: Family Medicine | Admitting: Family Medicine

## 2010-11-09 ENCOUNTER — Ambulatory Visit (HOSPITAL_BASED_OUTPATIENT_CLINIC_OR_DEPARTMENT_OTHER)
Admission: RE | Admit: 2010-11-09 | Discharge: 2010-11-09 | Payer: Self-pay | Source: Home / Self Care | Attending: Family Medicine | Admitting: Family Medicine

## 2010-11-10 ENCOUNTER — Telehealth (INDEPENDENT_AMBULATORY_CARE_PROVIDER_SITE_OTHER): Payer: Self-pay | Admitting: *Deleted

## 2010-11-10 ENCOUNTER — Encounter: Payer: Self-pay | Admitting: Family Medicine

## 2010-11-15 ENCOUNTER — Encounter: Payer: Self-pay | Admitting: Family Medicine

## 2010-11-18 ENCOUNTER — Encounter: Payer: Self-pay | Admitting: Family Medicine

## 2010-11-22 ENCOUNTER — Encounter: Payer: Self-pay | Admitting: Family Medicine

## 2010-11-26 ENCOUNTER — Encounter: Payer: Self-pay | Admitting: Family Medicine

## 2010-11-28 ENCOUNTER — Encounter: Payer: Self-pay | Admitting: Internal Medicine

## 2010-12-07 NOTE — Assessment & Plan Note (Signed)
Summary: SICK CAN'T TAKE MED//PH   Vital Signs:  Patient profile:   77 year old female Weight:      157 pounds Temp:     98.4 degrees F oral Pulse rate:   82 / minute Pulse rhythm:   regular BP sitting:   140 / 82  (left arm) Cuff size:   large  Vitals Entered By: Army Fossa CMA (January 05, 2010 4:06 PM) CC: Pt c/o being very tired, feeling nauseas.  CBG Result 85   History of Present Illness: Pt here with daughter and son---pt c/o being tired and sometimes feeling nauseous.   Pt did not take any of her am meds last week because she said they made her sick.   Pt has not been sick since.  Pt with a lot of vague symptoms.  No CP,  some DOE but no more than usual.   No congestion, no urinary symptoms.  No increased confusion.    Current Medications (verified): 1)  Aricept 10 Mg Tabs (Donepezil Hydrochloride) .... Take One Tablet Daily 2)  Norvasc 5 Mg  Tabs (Amlodipine Besylate) .Marland Kitchen.. 1 By Mouth Qd 3)  Levothyroxine Sodium 88 Mcg Tabs (Levothyroxine Sodium) .Marland Kitchen.. 1 By Mouth Once Daily. 4)  Pravachol 80 Mg  Tabs (Pravastatin Sodium) .Marland Kitchen.. 1 By Mouth At Bedtime, 5)  Namenda 10 Mg Tabs (Memantine Hcl) .Marland Kitchen.. 1 By Mouth Two Times A Day 6)  Antivert 25 Mg Tabs (Meclizine Hcl) .Marland Kitchen.. 1 By Mouth Qid As Needed 7)  Nebulizer Compressor  Kit (Respiratory Therapy Supplies) .... As Directed 8)  Duoneb 0.5-2.5 (3) Mg/68ml Soln (Ipratropium-Albuterol) .... 3ml Via Neb Qid As Needed 9)  Symbicort 160-4.5 Mcg/act Aero (Budesonide-Formoterol Fumarate) .... 2 Puffs Two Times A Day 10)  Proair Hfa 108 (90 Base) Mcg/act Aers (Albuterol Sulfate) .... 2 Puffs Qid 11)  Microspacer  Misc (Spacer/aero-Holding [employer]) .... As Directed  Allergies: 1)  ! Zocor 2)  ! Lipitor 3)  ! * Crestor 4)  ! Clinoril  Past History:  Past medical, surgical, family and social histories (including risk factors) reviewed for relevance to current acute and chronic problems.  Past Medical History: Reviewed history from  06/10/2007 and no changes required. Asthma Breast cancer, hx of, 1998 left, reocc.2003 COPD Hyperlipidemia Hypertension Hypothyroidism Low back pain, spinal stenosis Low back pain, compression fx T12 2005 Dementia (05/24/2007), alzheimers  Past Surgical History: Reviewed history from 12/26/2006 and no changes required. Carotid endarterectomy Cataract extraction Hysterectomy Lumpectomy Mastectomy, L 07/15/02 Back surgery, 3/03, spinal stenosis  Family History: Reviewed history and no changes required.  Social History: Reviewed history from 04/08/2008 and no changes required. Former smoker - quit 10 years ago .  smoked 40+ years 1-2 ppd Alcohol use-no Divorced Accompanied by supportive daughter  Review of Systems      See HPI  Physical Exam  General:  alert, well-developed, well-nourished, and well-hydrated.   Neck:  No deformities, masses, or tenderness noted. Lungs:  Normal respiratory effort, chest expands symmetrically. Lungs are clear to auscultation, no crackles or wheezes. Heart:  normal rate and no murmur.   Skin:  Intact without suspicious lesions or rashes Psych:  normally interactive and good eye contact.     Impression & Recommendations:  Problem # 1:  FATIGUE (ICD-780.79) check labs at [facility] including UA hgb low---start MVI with iron  Problem # 2:  DEMENTIA (ICD-294.8) cont meds f/u neurology  Problem # 3:  HYPOTHYROIDISM (ICD-244.9)  check labs Her updated medication list for this  problem includes:    Levothyroxine Sodium 88 Mcg Tabs (Levothyroxine sodium) .Marland Kitchen... 1 by mouth once daily.  Labs Reviewed: TSH: 3.35 (11/23/2009)    Chol: 172 (03/20/2009)   HDL: 53 (03/20/2009)   LDL: 74 (03/20/2009)   TG: 225 (03/20/2009)  Complete Medication List: 1)  Aricept 10 Mg Tabs (Donepezil hydrochloride) .... Take one tablet daily 2)  Norvasc 5 Mg Tabs (Amlodipine besylate) .Marland Kitchen.. 1 by mouth qd 3)  Levothyroxine Sodium 88 Mcg Tabs (Levothyroxine sodium) .Marland Kitchen..  1 by mouth once daily. 4)  Pravachol 80 Mg Tabs (Pravastatin sodium) .Marland Kitchen.. 1 by mouth at bedtime, 5)  Namenda 10 Mg Tabs (Memantine hcl) .Marland Kitchen.. 1 by mouth two times a day 6)  Antivert 25 Mg Tabs (Meclizine hcl) .Marland Kitchen.. 1 by mouth qid as needed 7)  Nebulizer Compressor Kit (Respiratory therapy supplies) .... As directed 8)  Duoneb 0.5-2.5 (3) Mg/25ml Soln (Ipratropium-albuterol) .... 3ml via neb qid as needed 9)  Symbicort 160-4.5 Mcg/act Aero (Budesonide-formoterol fumarate) .... 2 puffs two times a day 10)  Proair Hfa 108 (90 Base) Mcg/act Aers (Albuterol sulfate) .... 2 puffs qid 11)  Microspacer Misc (Spacer/aero-holding chambers) .... As directed  Other Orders: Capillary Blood Glucose/CBG (04540)  Patient Instructions: 1)  780.79  244.9  cbcd, ibc, ferritin, b12, vita D,  TSH,  bmp,  UA ( pt unable to void in office)  Laboratory Results   Blood Tests     CBG Random:: 85mg /dL   CBC   HGB:  98.1 g/dL   (Normal Range: 19.1-47.8 in Males, 12.0-15.0 in Females)

## 2010-12-07 NOTE — Progress Notes (Signed)
Summary: Refill Concerns(LMOM)  Phone Note Outgoing Call Call back at [facility] Phone [phone]   Call placed by: [name],  November 09, 2009 2:23 PM Details for Reason: MED REFILL Summary of Call: Left message on machine for patient to return call when avaliable, Reason for call:   Patient was given RX's(Namenda/Pravastatin) on 10/05/2009, now the pharmacy is requesting refill, ? what happened to the RX's given on 10/05/2009  [name]  November 09, 2009 2:25 PM

## 2010-12-07 NOTE — Letter (Signed)
Summary: [facility]  [facility]   Imported By: Lanelle Bal 02/09/2010 09:15:38  _____________________________________________________________________  External Attachment:    Type:   Image     Comment:   External Document

## 2010-12-07 NOTE — Consult Note (Signed)
Summary: Consultation Report-OFFICE NOTE  Consultation Report-OFFICE NOTE   Imported By: Vanessa Swaziland 07/31/2007 11:49:02  _____________________________________________________________________  External Attachment:    Type:   Image     Comment:   External Document

## 2010-12-07 NOTE — Progress Notes (Signed)
Summary: Regarding voicemail   Phone Note Call from Patient Call back at Home Phone [phone]   Caller: Daughter Summary of Call: Pt called on Friday 11/13/09 and left a voicemail, I called pt back regarding there care and left a message to call back. Army Fossa CMA  November 16, 2009 8:10 AM  Follow-up for Phone Call        Can she have the generic synthroid medicare will not pay for brandname anymore.  Follow-up by: Army Fossa CMA,  November 16, 2009 5:10 PM  Additional Follow-up for Phone Call Additional follow up Details #1::        she can but it may require more blood work because the generic are not always the same.   Additional Follow-up by: Loreen Freud DO,  November 16, 2009 5:13 PM    Additional Follow-up for Phone Call Additional follow up Details #2::    Informed Harriett Sine of this- she states they are going to go to [facility] to have this drawn. They do need a refill on the medication at the moment. Army Fossa CMA  November 18, 2009 1:44 PM

## 2010-12-07 NOTE — Progress Notes (Signed)
Summary: HANDICAP PLACARD RENEWAL FORM  Phone Note Call from Patient   Caller: SON BOBBY Codd - [phone] Summary of Call: PATIENT'S SON BROUGHT IN HANDICAP PLACARD FORM TO BE FILLED OUT---WHEN COMPLETED, PLEASE CALL BOBBY Cammack TO PICK IT UP  WILL TAKE TO [facility] IN [facility] [place] Initial call taken by: Jerolyn Shin,  December 18, 2009 3:16 PM  Follow-up for Phone Call        done Follow-up by: Loreen Freud DO,  December 21, 2009 8:56 AM  Additional Follow-up for Phone Call Additional follow up Details #1::        Pt son aware form is upfront. Army Fossa CMA  December 21, 2009 9:24 AM

## 2010-12-07 NOTE — Progress Notes (Signed)
  Phone Note Call from Patient   Summary of Call: Needs generic synthroid- sent to pharm.    New/Updated Medications: LEVOTHYROXINE SODIUM 88 MCG TABS (LEVOTHYROXINE SODIUM)  LEVOTHYROXINE SODIUM 88 MCG TABS (LEVOTHYROXINE SODIUM) 1 by mouth once daily. Prescriptions: LEVOTHYROXINE SODIUM 88 MCG TABS (LEVOTHYROXINE SODIUM) 1 by mouth once daily.  #30 x 2   Entered by:   Army Fossa CMA   Authorized by:   Loreen Freud DO   Signed by:   Army Fossa CMA on 12/29/2009   Method used:   Electronically to        [facility] (retail)       [address]       [place], [place]  [address]       Ph: [phone] or [phone]       Fax: [phone]   RxID:   [id number]

## 2010-12-07 NOTE — Letter (Signed)
Summary: Colonoscopy-Changed to Office Visit Letter  [facility]  [address] [place], [place] [address]   Phone: [phone]  Fax: [phone]      Mar 31, 2010 MRN: [id number]   [facility] [address] [place], [place]  [address]   Dear Ms. Rohwer,   According to our records, it is time for you to schedule a Colonoscopy. However, after reviewing your medical record, I feel that an office visit would be most appropriate to more completely evaluate you and determine your need for a repeat procedure.  Please call [phone] (option #2) at your convenience to schedule an office visit. If you have any questions, concerns, or feel that this letter is in error, we would appreciate your call.   Sincerely,  Hedwig Morton. Juanda Chance, M.D.  [facility] [phone]

## 2010-12-07 NOTE — Progress Notes (Signed)
Summary: refill  Phone Note Refill Request Message from:  Fax from Pharmacy on December 21, 2009 11:04 AM  levothyroxine  [facility] [address] fax# [phone]   Method Requested: Fax to Local Pharmacy Next Appointment Scheduled: no appt Initial call taken by: Barb Merino,  December 21, 2009 11:05 AM    Prescriptions: SYNTHROID 88 MCG  TABS (LEVOTHYROXINE SODIUM) 1 by mouth qd  #30 Tablet x 2   Entered by:   Army Fossa CMA   Authorized by:   Loreen Freud DO   Signed by:   Army Fossa CMA on 12/21/2009   Method used:   Electronically to        [facility] (retail)       [address]       [place], [place]  [address]       Ph: [phone] or [phone]       Fax: [phone]   RxID:   [id number]

## 2010-12-07 NOTE — Progress Notes (Signed)
Summary: Regarding meds   Phone Note Call from Patient   Summary of Call: Spoke with pts daughter about the rx's and she said her brother is in charge of her meds she will call him to see if he has the rx's. I told pt to call back if he did not have them and we would go from there to get her refills.  Initial call taken by: Army Fossa CMA,  November 10, 2009 11:44 AM

## 2010-12-07 NOTE — Letter (Signed)
Summary: [facility]  [facility]   Imported By: Lanelle Bal 08/09/2010 08:14:32  _____________________________________________________________________  External Attachment:    Type:   Image     Comment:   External Document

## 2010-12-07 NOTE — Progress Notes (Signed)
Summary: Lab Results   Phone Note Outgoing Call   Call placed by: Army Fossa CMA,  January 11, 2010 3:10 PM Reason for Call: Discuss lab or test results Summary of Call: Regarding Lab Results, Left message for Antonieta Iba:  low vita d---- Florentina Addison D 50000 1 by mouth weekly and take vita D3 2000u daily---recheck 3 months Signed by Loreen Freud DO on 01/10/2010 at 9:28 AM  Follow-up for Phone Call        Pts daughter is aware. Army Fossa CMA  January 11, 2010 3:58 PM     New/Updated Medications: VITAMIN D (ERGOCALCIFEROL) 50000 UNIT CAPS (ERGOCALCIFEROL) 1 by mouth weekly. VITAMIN D3 2000 UNIT CAPS (CHOLECALCIFEROL) 1 by mouth daily Prescriptions: VITAMIN D (ERGOCALCIFEROL) 50000 UNIT CAPS (ERGOCALCIFEROL) 1 by mouth weekly.  #4 x 2   Entered by:   Army Fossa CMA   Authorized by:   Loreen Freud DO   Signed by:   Army Fossa CMA on 01/11/2010   Method used:   Electronically to        [facility] (retail)       [address]       [place], [place]  [address]       Ph: [phone] or [phone]       Fax: [phone]   RxID:   [id number]

## 2010-12-07 NOTE — Assessment & Plan Note (Signed)
Summary: in bed for 2 days//no energy//lch   Vital Signs:  Patient profile:   77 year old female Weight:      153.38 pounds Temp:     98.6 degrees F oral Pulse rate:   78 / minute Pulse rhythm:   regular BP sitting:   134 / 80  (left arm) Cuff size:   large  Vitals Entered By: Army Fossa CMA (Apr 02, 2010 9:59 AM) Comments unsure of meds- her son is in control of that.   History of Present Illness: Pt here with daughter.  She just doesn't feel well.  No appetite.  Pt has not felt like getting out of bed for 2 days.  Pt can't describe why she doesn't feel good. Pt states she has a little congestion in nose and her back hurts.     Current Medications (verified): 1)  Aricept 10 Mg Tabs (Donepezil Hydrochloride) .... Take One Tablet Daily 2)  Norvasc 5 Mg  Tabs (Amlodipine Besylate) .Marland Kitchen.. 1 By Mouth Qd 3)  Levothyroxine Sodium 88 Mcg Tabs (Levothyroxine Sodium) .Marland Kitchen.. 1 By Mouth Once Daily. 4)  Pravachol 80 Mg  Tabs (Pravastatin Sodium) .Marland Kitchen.. 1 By Mouth At Bedtime, 5)  Namenda 10 Mg Tabs (Memantine Hcl) .Marland Kitchen.. 1 By Mouth Two Times A Day 6)  Antivert 25 Mg Tabs (Meclizine Hcl) .Marland Kitchen.. 1 By Mouth Qid As Needed 7)  Nebulizer Compressor  Kit (Respiratory Therapy Supplies) .... As Directed 8)  Duoneb 0.5-2.5 (3) Mg/47ml Soln (Ipratropium-Albuterol) .... 3ml Via Neb Qid As Needed 9)  Symbicort 160-4.5 Mcg/act Aero (Budesonide-Formoterol Fumarate) .... 2 Puffs Two Times A Day 10)  Proair Hfa 108 (90 Base) Mcg/act Aers (Albuterol Sulfate) .... 2 Puffs Qid 11)  Microspacer  Misc (Spacer/aero-Holding [employer]) .... As Directed 12)  Vitamin D (Ergocalciferol) 50000 Unit Caps (Ergocalciferol) .Marland Kitchen.. 1 By Mouth Weekly. 13)  Vitamin D3 2000 Unit Caps (Cholecalciferol) .Marland Kitchen.. 1 By Mouth Daily  Allergies: 1)  ! Zocor 2)  ! Lipitor 3)  ! * Crestor 4)  ! Clinoril  Past History:  Past medical, surgical, family and social histories (including risk factors) reviewed for relevance to current acute and  chronic problems.  Past Medical History: Reviewed history from 06/10/2007 and no changes required. Asthma Breast cancer, hx of, 1998 left, reocc.2003 COPD Hyperlipidemia Hypertension Hypothyroidism Low back pain, spinal stenosis Low back pain, compression fx T12 2005 Dementia (05/24/2007), alzheimers  Past Surgical History: Reviewed history from 12/26/2006 and no changes required. Carotid endarterectomy Cataract extraction Hysterectomy Lumpectomy Mastectomy, L 07/15/02 Back surgery, 3/03, spinal stenosis  Family History: Reviewed history and no changes required.  Social History: Reviewed history from 04/08/2008 and no changes required. Former smoker - quit 10 years ago .  smoked 40+ years 1-2 ppd Alcohol use-no Divorced Accompanied by supportive daughter  Review of Systems General:  Complains of fatigue, malaise, and weakness; denies chills, fever, loss of appetite, sleep disorder, sweats, and weight loss. ENT:  Complains of nasal congestion and postnasal drainage; denies decreased hearing, difficulty swallowing, ear discharge, earache, hoarseness, nosebleeds, ringing in ears, sinus pressure, and sore throat. CV:  Complains of lightheadness; denies bluish discoloration of lips or nails, chest pain or discomfort, difficulty breathing at night, difficulty breathing while lying down, fainting, fatigue, leg cramps with exertion, near fainting, palpitations, shortness of breath with exertion, swelling of feet, swelling of hands, and weight gain. Resp:  Denies chest discomfort, chest pain with inspiration, cough, coughing up blood, excessive snoring, hypersomnolence, morning headaches, pleuritic, shortness of breath,  sputum productive, and wheezing. Neuro:  Complains of headaches and sensation of room spinning.  Physical Exam  General:  Well-developed,well-nourished,in no acute distress; alert,appropriate and cooperative throughout examination Ears:  + cerumen b/L  Nose:  no  external deformity, mucosal erythema, and mucosal edema.   Mouth:  Oral mucosa and oropharynx without lesions or exudates.  Teeth in good repair. Neck:  No deformities, masses, or tenderness noted. Lungs:  Normal respiratory effort, chest expands symmetrically. Lungs are clear to auscultation, no crackles or wheezes. Heart:  normal rate.   Abdomen:  Bowel sounds positive,abdomen soft and non-tender without masses, organomegaly or hernias noted. Skin:  Intact without suspicious lesions or rashes Cervical Nodes:  No lymphadenopathy noted Psych:  normally interactive, good eye contact, not anxious appearing, and not depressed appearing.     Impression & Recommendations:  Problem # 1:  WEAKNESS (ICD-780.79)  Orders: Venipuncture (51025) TLB-B12 + Folate Pnl (85277_82423-N36/RWE) TLB-BMP (Basic Metabolic Panel-BMET) (80048-METABOL) TLB-CBC Platelet - w/Differential (85025-CBCD) TLB-Hepatic/Liver Function Pnl (80076-HEPATIC) TLB-TSH (Thyroid Stimulating Hormone) (84443-TSH) UA Dipstick w/o Micro (manual) (31540) EKG w/ Interpretation (93000)  Problem # 2:  FATIGUE (ICD-780.79)  Orders: Venipuncture (08676)  Problem # 3:  CERUMEN IMPACTION, LEFT (ICD-380.4) use debrox at home and rto if unsuccessful  Problem # 4:  DIZZINESS (ICD-780.4)  Her updated medication list for this problem includes:    Antivert 25 Mg Tabs (Meclizine hcl) .Marland Kitchen... 1 by mouth qid as needed  Orders: Venipuncture (19509) TLB-B12 + Folate Pnl (32671_24580-D98/PJA) TLB-BMP (Basic Metabolic Panel-BMET) (80048-METABOL) TLB-CBC Platelet - w/Differential (85025-CBCD) TLB-Hepatic/Liver Function Pnl (80076-HEPATIC) TLB-TSH (Thyroid Stimulating Hormone) (84443-TSH) UA Dipstick w/o Micro (manual) (25053) EKG w/ Interpretation (93000)  Demonstrated maneuvers to self-treat vertigo. Patient to call to be seen if no improvement in 10-14 days, sooner if worse.   Complete Medication List: 1)  Aricept 10 Mg Tabs  (Donepezil hydrochloride) .... Take one tablet daily 2)  Norvasc 5 Mg Tabs (Amlodipine besylate) .Marland Kitchen.. 1 by mouth qd 3)  Levothyroxine Sodium 88 Mcg Tabs (Levothyroxine sodium) .Marland Kitchen.. 1 by mouth once daily. 4)  Pravachol 80 Mg Tabs (Pravastatin sodium) .Marland Kitchen.. 1 by mouth at bedtime, 5)  Namenda 10 Mg Tabs (Memantine hcl) .Marland Kitchen.. 1 by mouth two times a day 6)  Antivert 25 Mg Tabs (Meclizine hcl) .Marland Kitchen.. 1 by mouth qid as needed 7)  Nebulizer Compressor Kit (Respiratory therapy supplies) .... As directed 8)  Duoneb 0.5-2.5 (3) Mg/58ml Soln (Ipratropium-albuterol) .... 3ml via neb qid as needed 9)  Symbicort 160-4.5 Mcg/act Aero (Budesonide-formoterol fumarate) .... 2 puffs two times a day 10)  Proair Hfa 108 (90 Base) Mcg/act Aers (Albuterol sulfate) .... 2 puffs qid 11)  Microspacer Misc (Spacer/aero-holding chambers) .... As directed 12)  Vitamin D (ergocalciferol) 50000 Unit Caps (Ergocalciferol) .Marland Kitchen.. 1 by mouth weekly. 13)  Vitamin D3 2000 Unit Caps (Cholecalciferol) .Marland Kitchen.. 1 by mouth daily   EKG  Procedure date:  04/02/2010  Findings:      Normal sinus rhythm with rate of:  Right bundle branch block.     Laboratory Results   Urine Tests   Date/Time Reported: Apr 02, 2010 11:13 AM   Routine Urinalysis   Color: straw Appearance: Clear Glucose: negative   (Normal Range: Negative) Bilirubin: negative   (Normal Range: Negative) Ketone: negative   (Normal Range: Negative) Spec. Gravity: >=1.030   (Normal Range: 1.003-1.035) Blood: negative   (Normal Range: Negative) pH: 6.0   (Normal Range: 5.0-8.0) Protein: 30   (Normal Range: Negative) Urobilinogen: negative   (  Normal Range: 0-1) Nitrite: negative   (Normal Range: Negative) Leukocyte Esterace: negative   (Normal Range: Negative)    Comments: Floydene Flock  Apr 02, 2010 11:13 AM

## 2010-12-07 NOTE — Progress Notes (Signed)
Summary: Levothyroxine refill  Phone Note Refill Request Message from:  Fax from Pharmacy on September 28, 2010 9:53 AM  Refills Requested: Medication #1:  LEVOTHYROXINE SODIUM 88 MCG TABS 1 by mouth once daily. [facility] [address], [place], [place]    [phone]   fax [phone]  Next Appointment Scheduled: none Initial call taken by: Jerolyn Shin,  September 28, 2010 9:54 AM    Prescriptions: LEVOTHYROXINE SODIUM 88 MCG TABS (LEVOTHYROXINE SODIUM) 1 by mouth once daily.  #30 Tablet x 5   Entered by:   Jeremy Johann CMA   Authorized by:   Loreen Freud DO   Signed by:   Jeremy Johann CMA on 09/28/2010   Method used:   Faxed to ...       [facility] [address] [facility] [address] (retail)       [address]       [place], [place]  [address]       Ph: [phone] or [phone]       Fax: [phone]   RxID:   [id number]

## 2010-12-07 NOTE — Progress Notes (Signed)
Summary: Lab Results   Phone Note Outgoing Call   Reason for Call: Discuss lab or test results Summary of Call: Regarding lab results, LMTCB:  How is pt feeling?  Labs are ok---hgb elevated but pt has copd--otherwise labs are normal Signed by Loreen Freud DO on 04/07/2010 at 8:51 PM  Follow-up for Phone Call        left message with a female at the pts house. Army Fossa CMA  April 09, 2010 10:04 AM   Additional Follow-up for Phone Call Additional follow up Details #1::        Spoke with daughter and he said that she is feeling much better and is up and moving around.  Additional Follow-up by: Harold Barban,  April 09, 2010 12:53 PM

## 2010-12-08 ENCOUNTER — Encounter: Payer: Self-pay | Admitting: Family Medicine

## 2010-12-09 NOTE — Miscellaneous (Signed)
Summary: Orders & Copy of FL2[facility]  Orders & Copy of [facility]   Imported By: Lanelle Bal 11/16/2010 13:07:50  _____________________________________________________________________  External Attachment:    Type:   Image     Comment:   External Document

## 2010-12-09 NOTE — Letter (Signed)
Summary: [facility]  [facility]   Imported By: Lanelle Bal 11/24/2010 13:05:44  _____________________________________________________________________  External Attachment:    Type:   Image     Comment:   External Document

## 2010-12-09 NOTE — Progress Notes (Signed)
Summary: Prior Auth  Phone Note Refill Request Call back at [phone] Message from:  Pharmacy on November 04, 2010 3:06 PM  Refills Requested: Medication #1:  IPRATROPIUM-ALBUTEROL 0.5-2.5  AS 3 MG/3CC 1 ampule every 6 hrs as needed   Dosage confirmed as above?Dosage Confirmed Prior Auth from [facility] [address]  Initial call taken by: Harold Barban,  November 04, 2010 3:07 PM  Follow-up for Phone Call        Spoke with representative med can be filled under medicare part B plan, pharmacy notified via fax...............Marland KitchenFelecia Deloach CMA  November 05, 2010 4:48 PM

## 2010-12-09 NOTE — Progress Notes (Signed)
Summary: Results 12/27  Phone Note Outgoing Call   Call placed by: Almeta Monas CMA [facility] Dull),  November 02, 2010 10:13 AM Call placed to: Patient Details for Reason: low vita D-----if pt is taking 2000u daily add vita D 50000u weekly recheck 3 months  Summary of Call: spoke with daughter Harriett Sine and she was not sure what the patient was taking, stated she will call me right back... Almeta Monas CMA [facility] Dull)  November 02, 2010 10:14 AM   spoke with daughter nancy and she would like for [place] to print the Rx for the Vitamin D 50,000 u and leave it upfront for pick up since they are in the process of switching pharmacies.... Initial call taken by: Almeta Monas CMA [facility] Dull),  November 03, 2010 3:16 PM    New/Updated Medications: VITAMIN D (ERGOCALCIFEROL) 50000 UNIT CAPS (ERGOCALCIFEROL) 1 by mouth once weekly x12 weeks Prescriptions: VITAMIN D (ERGOCALCIFEROL) 50000 UNIT CAPS (ERGOCALCIFEROL) 1 by mouth once weekly x12 weeks  #4 x 3   Entered by:   Almeta Monas CMA (AAMA)   Authorized by:   Neena Rhymes MD   Signed by:   Almeta Monas CMA (AAMA) on 11/03/2010   Method used:   Print then Give to Patient   RxID:   [id number]

## 2010-12-09 NOTE — Assessment & Plan Note (Signed)
Summary: cough,fever//fd   Vital Signs:  Patient profile:   77 year old female Weight:      153.8 pounds BMI:     24.91 O2 Sat:      95 % on Room air Temp:     98.2 degrees F oral Pulse rate:   99 / minute Resp:     17 per minute BP sitting:   118 / 66  (right arm) Cuff size:   large  Vitals Entered By: Shonna Chock CMA (November 04, 2010 1:48 PM)  O2 Flow:  Room air CC: Cough (productive at times) and fever x 1 week , COPD follow-up   Primary Care Provider:  Laury Axon  CC:  Cough (productive at times) and fever x 1 week  and COPD follow-up.  History of Present Illness:      This is an 77 year old woman who presents for  cough in context of COPD .Remote smoker for decades & PMH of asthma. Onset 12/26 as cough.  The patient has dementia ; her daughter Antonieta Iba provides the history.She  reports shortness of breath, wheezing, and increased  yellow sputum, but denies chest tightness and nocturnal awakening.  There has been   limitation of most activities; she was in bed this am.   She has a  home nebulizer; ? no med for it.  Medications include ProAir;  ; the daughter is unsure if used. The  controller med  is being used ? 2 sprays two times a day. The patient denies fever, hemoptysis, and malaise.  Associated symtpoms include nasal congestion w/o purulence..  The patient denies the following symptoms: cold/URI symptoms and chronic rhinitis.    Current Medications (verified): 1)  Aricept 10 Mg Tabs (Donepezil Hydrochloride) .... Take One Tablet Daily 2)  Norvasc 5 Mg  Tabs (Amlodipine Besylate) .Marland Kitchen.. 1 By Mouth Qd 3)  Levothyroxine Sodium 88 Mcg Tabs (Levothyroxine Sodium) .Marland Kitchen.. 1 By Mouth Once Daily. 4)  Pravachol 80 Mg  Tabs (Pravastatin Sodium) .Marland Kitchen.. 1 By Mouth At Bedtime, 5)  Namenda 10 Mg Tabs (Memantine Hcl) .Marland Kitchen.. 1 By Mouth Two Times A Day 6)  Antivert 25 Mg Tabs (Meclizine Hcl) .Marland Kitchen.. 1 By Mouth Qid As Needed 7)  Nebulizer Compressor  Kit (Respiratory Therapy Supplies) .... As  Directed 8)  Duoneb 0.5-2.5 (3) Mg/13ml Soln (Ipratropium-Albuterol) .... 3ml Via Neb Qid As Needed 9)  Symbicort 160-4.5 Mcg/act Aero (Budesonide-Formoterol Fumarate) .... 2 Puffs Two Times A Day 10)  Proair Hfa 108 (90 Base) Mcg/act Aers (Albuterol Sulfate) .... 2 Puffs Qid 11)  Vitamin D (Ergocalciferol) 50000 Unit Caps (Ergocalciferol) .Marland Kitchen.. 1 By Mouth Once Weekly X12 Weeks 12)  Aspirin 81 Mg Tabs (Aspirin) .Marland Kitchen.. 1 By Mouth Once Daily  Allergies: 1)  ! Zocor 2)  ! Lipitor 3)  ! * Crestor 4)  ! Clinoril  Review of Systems Eyes:  Denies discharge, eye pain, and red eye. ENT:  Denies ear discharge and earache; No facial pain  , frontal headache or significant purulence.  Physical Exam  General:  well- groomed ,in no acute distress;  unable to provide history but  cooperative throughout examination Eyes:  No corneal or conjunctival inflammation noted Ears:  External ear exam shows no significant lesions or deformities.  Otoscopic examination reveals clear canals, tympanic membranes are intact bilaterally without bulging, retraction, inflammation or discharge. Hearing is grossly normal bilaterally. Nose:  External nasal examination shows no deformity or inflammation. Nasal mucosa are pink and moist without lesions or exudates.  Mouth:  Oral mucosa and oropharynx without lesions or exudates.  Dentures Lungs:  Normal respiratory effort, chest expands symmetrically. Lungs: diffuse , homogenous musical wheezes. Heart:  Normal rate and regular rhythm. S1 and S2 normal without gallop, murmur, click, rub. S4 Extremities:  Mild clubbing; no cyanosis, edema. Skin:  Intact without suspicious lesions or rashes Cervical Nodes:  No lymphadenopathy noted Axillary Nodes:  No palpable lymphadenopathy Psych:  flat affect and subdued.     Impression & Recommendations:  Problem # 1:  BRONCHITIS, ACUTE WITH MILD BRONCHOSPASM (ICD-466.0)  Her updated medication list for this problem includes:     Symbicort 160-4.5 Mcg/act Aero (Budesonide-formoterol fumarate) .Marland Kitchen... 2 puffs two times a day    Proair Hfa 108 (90 Base) Mcg/act Aers (Albuterol sulfate) .Marland Kitchen... 2 puffs qid    Azithromycin 250 Mg Tabs (Azithromycin) .Marland Kitchen... As per pack  Orders: Prescription Created Electronically [id number])  Problem # 2:  OBSTRUCTIVE CHRONIC BRONCHITIS WITH EXACERBATION (ICD-491.21)  Orders: Prescription Created Electronically [phone])  Problem # 3:  DEMENTIA (ICD-294.8) transfer scheduled  to Memory Unit @ [facility] 12/30  Complete Medication List: 1)  Aricept 10 Mg Tabs (Donepezil hydrochloride) .... Take one tablet daily 2)  Norvasc 5 Mg Tabs (Amlodipine besylate) .Marland Kitchen.. 1 by mouth qd 3)  Levothyroxine Sodium 88 Mcg Tabs (Levothyroxine sodium) .Marland Kitchen.. 1 by mouth once daily. 4)  Pravachol 80 Mg Tabs (Pravastatin sodium) .Marland Kitchen.. 1 by mouth at bedtime, 5)  Namenda 10 Mg Tabs (Memantine hcl) .Marland Kitchen.. 1 by mouth two times a day 6)  Antivert 25 Mg Tabs (Meclizine hcl) .Marland Kitchen.. 1 by mouth qid as needed 7)  Nebulizer Compressor Kit (Respiratory therapy supplies) .... As directed 8)  Ipratropium-albuterol 0.5-2.5 As 3 Mg/3cc  .Marland Kitchen.. 1 ampule every 6 hrs as needed 9)  Symbicort 160-4.5 Mcg/act Aero (Budesonide-formoterol fumarate) .... 2 puffs two times a day 10)  Proair Hfa 108 (90 Base) Mcg/act Aers (Albuterol sulfate) .... 2 puffs qid 11)  Vitamin D (ergocalciferol) 50000 Unit Caps (Ergocalciferol) .Marland Kitchen.. 1 by mouth once weekly x12 weeks 12)  Aspirin 81 Mg Tabs (Aspirin) .Marland Kitchen.. 1 by mouth once daily 13)  Azithromycin 250 Mg Tabs (Azithromycin) .... As per pack 14)  Prednisone 20 Mg Tabs (Prednisone) .Marland Kitchen.. 1  two times a day with a meal  Patient Instructions: 1)  Continue Symbicort 2 puffs two times a day ; gargle & spit after use. 2)  Drink as much  NON dairy fluid as you can tolerate for the next few days. Prescriptions: PREDNISONE 20 MG TABS (PREDNISONE) 1  two times a day with a meal  #14 x 0   Entered and Authorized by:    Marga Melnick MD   Signed by:   Marga Melnick MD on 11/04/2010   Method used:   Faxed to ...       [facility] [address] (retail)       [address]       [place], [place]  [address]       Ph: [phone] or [phone]       Fax: [phone]   RxID:   [id number] AZITHROMYCIN 250 MG TABS (AZITHROMYCIN) as per pack  #1 x 0   Entered and Authorized by:   Marga Melnick MD   Signed by:   Marga Melnick MD on 11/04/2010   Method used:   Faxed to ...       [facility] [address] (retail)       [address]       [place], [place]  [address]       Ph: [phone] or [phone]       Fax: [phone]   RxID:   [id number] IPRATROPIUM-ALBUTEROL 0.5-2.5  AS 3 MG/3CC 1 ampule every 6 hrs as needed  #20 x 0   Entered and Authorized by:   Marga Melnick MD   Signed by:   Marga Melnick MD on 11/04/2010   Method used:   Faxed to ...       [facility] [address] (retail)       [address]       [place], [place]  [address]       Ph: [phone] or [phone]       Fax: [phone]   RxID:   [id number]    Orders Added: 1)  Est. Patient Level IV [57322] 2)  Prescription Created Electronically [id number]

## 2010-12-09 NOTE — Assessment & Plan Note (Signed)
Summary: FOR PAPER WORK TO NURSING HOME//PH   Vital Signs:  Patient profile:   77 year old female Height:      66 inches Weight:      158.2 pounds BMI:     25.63 Temp:     98.2 degrees F oral BP sitting:   118 / 74  (right arm) Cuff size:   large  Vitals Entered By: Almeta Monas CMA [facility] Dull) (October 25, 2010 3:18 PM) CC: Needs eval and resident paperwork completed   History of Present Illness: Pt here with her son to have papers filled out for nursing home.    No new complaints.    Preventive Screening-Counseling & Management  Alcohol-Tobacco     Alcohol drinks/day: 0     Smoking Status: quit > 6 months  Caffeine-Diet-Exercise     Does Patient Exercise: no  Current Medications (verified): 1)  Aricept 10 Mg Tabs (Donepezil Hydrochloride) .... Take One Tablet Daily 2)  Norvasc 5 Mg  Tabs (Amlodipine Besylate) .Marland Kitchen.. 1 By Mouth Qd 3)  Levothyroxine Sodium 88 Mcg Tabs (Levothyroxine Sodium) .Marland Kitchen.. 1 By Mouth Once Daily. 4)  Pravachol 80 Mg  Tabs (Pravastatin Sodium) .Marland Kitchen.. 1 By Mouth At Bedtime, 5)  Namenda 10 Mg Tabs (Memantine Hcl) .Marland Kitchen.. 1 By Mouth Two Times A Day 6)  Antivert 25 Mg Tabs (Meclizine Hcl) .Marland Kitchen.. 1 By Mouth Qid As Needed 7)  Nebulizer Compressor  Kit (Respiratory Therapy Supplies) .... As Directed 8)  Duoneb 0.5-2.5 (3) Mg/81ml Soln (Ipratropium-Albuterol) .... 3ml Via Neb Qid As Needed 9)  Symbicort 160-4.5 Mcg/act Aero (Budesonide-Formoterol Fumarate) .... 2 Puffs Two Times A Day 10)  Proair Hfa 108 (90 Base) Mcg/act Aers (Albuterol Sulfate) .... 2 Puffs Qid 11)  Microspacer  Misc (Spacer/aero-Holding [employer]) .... As Directed 12)  Vitamin D3 2000 Unit Caps (Cholecalciferol) .Marland Kitchen.. 1 By Mouth Daily 13)  Zostavax [address] Unt/0.33ml Solr (Zoster Vaccine Live) .Marland Kitchen.. 1 Ml Im X1  Allergies (verified): 1)  ! Zocor 2)  ! Lipitor 3)  ! * Crestor 4)  ! Clinoril  Past History:  Past Medical History: Last updated: 06/10/2007 Asthma Breast cancer, hx of, 1998 left,  reocc.2003 COPD Hyperlipidemia Hypertension Hypothyroidism Low back pain, spinal stenosis Low back pain, compression fx T12 2005 Dementia (05/24/2007), alzheimers  Past Surgical History: Last updated: 12/26/2006 Carotid endarterectomy Cataract extraction Hysterectomy Lumpectomy Mastectomy, L 07/15/02 Back surgery, 3/03, spinal stenosis  Social History: Last updated: 04/08/2008 Former smoker - quit 10 years ago .  smoked 40+ years 1-2 ppd Alcohol use-no Divorced Accompanied by supportive daughter  Risk Factors: Smoking Status: quit > 6 months (10/25/2010)  Social History: Reviewed history from 04/08/2008 and no changes required. Former smoker - quit 10 years ago .  smoked 40+ years 1-2 ppd Alcohol use-no Divorced Accompanied by supportive daughterDoes Patient Exercise:  no  Review of Systems      See HPI  Physical Exam  General:  Well-developed,well-nourished,in no acute distress; alert,appropriate and cooperative throughout examination Lungs:  Normal respiratory effort, chest expands symmetrically. Lungs are clear to auscultation, no crackles or wheezes. Heart:  normal rate.   Extremities:  No clubbing, cyanosis, edema, or deformity noted with normal full range of motion of all joints.   Psych:  normally interactive, good eye contact, not anxious appearing, and not depressed appearing.     Impression & Recommendations:  Problem # 1:  UNSPECIFIED VITAMIN D DEFICIENCY (ICD-268.9)  Orders: Venipuncture (60454) T-Vitamin D (25-Hydroxy) (09811-91478) Specimen Handling (29562)  Problem # 2:  DIZZINESS (ICD-780.4)  Her updated medication list for this problem includes:    Antivert 25 Mg Tabs (Meclizine hcl) .Marland Kitchen... 1 by mouth qid as needed  Demonstrated maneuvers to self-treat vertigo. Patient to call to be seen if no improvement in 10-14 days, sooner if worse.   Problem # 3:  OBSTRUCTIVE CHRONIC BRONCHITIS WITH EXACERBATION (ICD-491.21)  Problem # 4:  DEMENTIA  (ICD-294.8) paper work filled out for nsg home placement  Problem # 5:  HYPOTHYROIDISM (ICD-244.9)  Her updated medication list for this problem includes:    Levothyroxine Sodium 88 Mcg Tabs (Levothyroxine sodium) .Marland Kitchen... 1 by mouth once daily.  Labs Reviewed: TSH: 2.13 (04/02/2010)    Chol: 158 (01/08/2010)   HDL: 47.90 (01/08/2010)   LDL: 87 (01/08/2010)   TG: 114.0 (01/08/2010)  Problem # 6:  HYPERTENSION (ICD-401.9)  Her updated medication list for this problem includes:    Norvasc 5 Mg Tabs (Amlodipine besylate) .Marland Kitchen... 1 by mouth qd  BP today: 118/74 Prior BP: 134/80 (04/02/2010)  Labs Reviewed: K+: 3.8 (04/02/2010) Creat: : 0.7 (04/02/2010)   Chol: 158 (01/08/2010)   HDL: 47.90 (01/08/2010)   LDL: 87 (01/08/2010)   TG: 114.0 (01/08/2010)  Problem # 7:  HYPERLIPIDEMIA (ICD-272.4)  Her updated medication list for this problem includes:    Pravachol 80 Mg Tabs (Pravastatin sodium) .Marland Kitchen... 1 by mouth at bedtime,  Labs Reviewed: SGOT: 29 (04/02/2010)   SGPT: 20 (04/02/2010)   HDL:47.90 (01/08/2010), 53 (03/20/2009)  LDL:87 (01/08/2010), 74 (03/20/2009)  Chol:158 (01/08/2010), 172 (03/20/2009)  Trig:114.0 (01/08/2010), 225 (03/20/2009)  Complete Medication List: 1)  Aricept 10 Mg Tabs (Donepezil hydrochloride) .... Take one tablet daily 2)  Norvasc 5 Mg Tabs (Amlodipine besylate) .Marland Kitchen.. 1 by mouth qd 3)  Levothyroxine Sodium 88 Mcg Tabs (Levothyroxine sodium) .Marland Kitchen.. 1 by mouth once daily. 4)  Pravachol 80 Mg Tabs (Pravastatin sodium) .Marland Kitchen.. 1 by mouth at bedtime, 5)  Namenda 10 Mg Tabs (Memantine hcl) .Marland Kitchen.. 1 by mouth two times a day 6)  Antivert 25 Mg Tabs (Meclizine hcl) .Marland Kitchen.. 1 by mouth qid as needed 7)  Nebulizer Compressor Kit (Respiratory therapy supplies) .... As directed 8)  Duoneb 0.5-2.5 (3) Mg/36ml Soln (Ipratropium-albuterol) .... 3ml via neb qid as needed 9)  Symbicort 160-4.5 Mcg/act Aero (Budesonide-formoterol fumarate) .... 2 puffs two times a day 10)  Proair Hfa 108 (90  Base) Mcg/act Aers (Albuterol sulfate) .... 2 puffs qid 11)  Microspacer Misc (Spacer/aero-holding chambers) .... As directed 12)  Vitamin D3 2000 Unit Caps (Cholecalciferol) .Marland Kitchen.. 1 by mouth daily 13)  Zostavax [address] Unt/0.45ml Solr (Zoster vaccine live) .Marland Kitchen.. 1 ml im x1  Other Orders: TB Skin Test [phone]) Admin 1st Vaccine (09811) Prescriptions: ZOSTAVAX 19400 UNT/0.65ML SOLR (ZOSTER VACCINE LIVE) 1 ml IM x1  #1 x 0   Entered and Authorized by:   Loreen Freud DO   Signed by:   Loreen Freud DO on 10/25/2010   Method used:   Print then Give to Patient   RxID:   [id number]    Orders Added: 1)  Venipuncture [78469] 2)  T-Vitamin D (25-Hydroxy) [62952-84132] 3)  Specimen Handling [99000] 4)  TB Skin Test [86580] 5)  Admin 1st Vaccine [90471] 6)  Est. Patient Level IV [44010]   Immunizations Administered:  PPD Skin Test:    Vaccine Type: PPD    Site: right forearm    Mfr: Sanofi Pasteur    Dose: 0.1 ml    Route: ID    Given by: Almeta Monas CMA (AAMA)  Exp. Date: 07/08/2012    Lot #: [id number]   Immunizations Administered:  PPD Skin Test:    Vaccine Type: PPD    Site: right forearm    Mfr: Sanofi Pasteur    Dose: 0.1 ml    Route: ID    Given by: Almeta Monas CMA (AAMA)    Exp. Date: 07/08/2012    Lot #: [id number]  Last Flu Vaccine:  Fluvax 3+ (08/19/2009 3:40:55 PM) Flu Vaccine Result Date:  08/18/2010 Flu Vaccine Result:  given Flu Vaccine Next Due:  1 yr PAP Next Due:  Refused Mammogram Next Due:  Refused

## 2010-12-09 NOTE — Miscellaneous (Signed)
Summary: Care Plan/[facility] Plan/[facility]   Imported By: Maryln Gottron 12/01/2010 15:57:24  _____________________________________________________________________  External Attachment:    Type:   Image     Comment:   External Document

## 2010-12-09 NOTE — Progress Notes (Signed)
Summary: Results of CXR  Phone Note Outgoing Call   Call placed by: Almeta Monas CMA Duncan Dull),  November 10, 2010 9:16 AM Call placed to: Patient Details for Reason: Results Summary of Call: no pneumonia + compression fractures low thoracic spine--- any back pain?  spoke with daughter Harriett Sine and she stated the patient has not complained of any back pain. Stated she has had some issuse in the past with her back, nothing recent. She stated it was pretty hard for the patient to communicate because of the Alzheimer's. I advised if they have any concerns to let us know and Dr. Laury Axon can proceed  further... she voiced understading, and agreed. Initial call taken by: Almeta Monas CMA (AAMA),  November 10, 2010 9:18 AM

## 2010-12-09 NOTE — Medication Information (Signed)
Summary: Denial for Nebulizer/[employer]  Denial for Nebulizer/[employer]   Imported By: Lanelle Bal 11/19/2010 07:55:34  _____________________________________________________________________  External Attachment:    Type:   Image     Comment:   External Document  Appended Document: Denial for Nebulizer/[employer] Her part B card should have been used for this med----not part D.     Appended Document: Denial for Nebulizer/[employer] See phone note dated 11-04-10, med has been filled under part B plan  Appended Document: Denial for Nebulizer/[employer] letter we got from ins co said it was denied because part D does not cover it.  Appended Document: Denial for Nebulizer/[employer] Spoke with pharmacy Rx went through awaiting Pt to pick up med

## 2010-12-09 NOTE — Assessment & Plan Note (Signed)
Summary: FEELING WORSE//[facility]   Vital Signs:  Patient profile:   77 year old female Weight:      147.8 pounds O2 Sat:      91 % on Room air Temp:     97.8 degrees F oral Pulse rate:   90 / minute Pulse rhythm:   regular BP sitting:   130 / 70  (left arm) Cuff size:   large  Vitals Entered By: Almeta Monas CMA Duncan Dull) (November 09, 2010 11:40 AM)  O2 Flow:  Room air CC: c/o not feeling any better--still having cough   History of Present Illness: Pt here f/u bronchitis---she is not feeling any better.  Pt has finished abx.  Pt daughter is with her.  [facility] into [facility] this weekend.   Pt still c/o productive cough.    Current Medications (verified): 1)  Aricept 10 Mg Tabs (Donepezil Hydrochloride) .... Take One Tablet Daily 2)  Norvasc 5 Mg  Tabs (Amlodipine Besylate) .Marland Kitchen.. 1 By Mouth Qd 3)  Levothyroxine Sodium 88 Mcg Tabs (Levothyroxine Sodium) .Marland Kitchen.. 1 By Mouth Once Daily. 4)  Pravachol 80 Mg  Tabs (Pravastatin Sodium) .Marland Kitchen.. 1 By Mouth At Bedtime, 5)  Namenda 10 Mg Tabs (Memantine Hcl) .Marland Kitchen.. 1 By Mouth Two Times A Day 6)  Antivert 25 Mg Tabs (Meclizine Hcl) .Marland Kitchen.. 1 By Mouth Qid As Needed 7)  Nebulizer Compressor  Kit (Respiratory Therapy Supplies) .... As Directed 8)  Ipratropium-Albuterol 0.5-2.5  As 3 Mg/3cc .Marland KitchenMarland Kitchen. 1 Ampule Every 6 Hrs As Needed 9)  Symbicort 160-4.5 Mcg/act Aero (Budesonide-Formoterol Fumarate) .... 2 Puffs Two Times A Day 10)  Proair Hfa 108 (90 Base) Mcg/act Aers (Albuterol Sulfate) .... 2 Puffs Qid 11)  Vitamin D (Ergocalciferol) 50000 Unit Caps (Ergocalciferol) .Marland Kitchen.. 1 By Mouth Once Weekly X12 Weeks 12)  Aspirin 81 Mg Tabs (Aspirin) .Marland Kitchen.. 1 By Mouth Once Daily 13)  Avelox 400 Mg Tabs (Moxifloxacin Hcl) .Marland Kitchen.. 1 By Mouth Once Daily 14)  Prednisone 10 Mg Tabs (Prednisone) .... 3 By Mouth Once Daily For 3 Days Then 2 By Mouth Once Daily For 3 Days Then 1 By Mouth Once Daily For 3 Days  Allergies (verified): 1)  ! Zocor 2)  ! Lipitor 3)  ! * Crestor 4)  !  Clinoril  Past History:  Past Medical History: Last updated: 06/10/2007 Asthma Breast cancer, hx of, 1998 left, reocc.2003 COPD Hyperlipidemia Hypertension Hypothyroidism Low back pain, spinal stenosis Low back pain, compression fx T12 2005 Dementia (05/24/2007), alzheimers  Past Surgical History: Last updated: 12/26/2006 Carotid endarterectomy Cataract extraction Hysterectomy Lumpectomy Mastectomy, L 07/15/02 Back surgery, 3/03, spinal stenosis  Social History: Last updated: 04/08/2008 Former smoker - quit 10 years ago .  smoked 40+ years 1-2 ppd Alcohol use-no Divorced Accompanied by supportive daughter  Risk Factors: Alcohol Use: 0 (10/25/2010) Exercise: no (10/25/2010)  Risk Factors: Smoking Status: quit > 6 months (10/25/2010)  Social History: Reviewed history from 04/08/2008 and no changes required. Former smoker - quit 10 years ago .  smoked 40+ years 1-2 ppd Alcohol use-no Divorced Accompanied by supportive daughter  Review of Systems      See HPI  Physical Exam  General:  Well-developed,well-nourished,in no acute distress; alert,appropriate and cooperative throughout examination Nose:  External nasal examination shows no deformity or inflammation. Nasal mucosa are pink and moist without lesions or exudates. Mouth:  Oral mucosa and oropharynx without lesions or exudates.  Teeth in good repair. Neck:  No deformities, masses, or tenderness noted. Lungs:  +  scattered exp wheeze normal respiratory effort.  -- some improvement after neb Heart:  normal rate and no murmur.   Psych:  Cognition and judgment appear intact. Alert and cooperative with normal attention span and concentration. No apparent delusions, illusions, hallucinations   Impression & Recommendations:  Problem # 1:  BRONCHITIS, ACUTE WITH MILD BRONCHOSPASM (ICD-466.0)  Her updated medication list for this problem includes:    Symbicort 160-4.5 Mcg/act Aero (Budesonide-formoterol  fumarate) .Marland Kitchen... 2 puffs two times a day    Proair Hfa 108 (90 Base) Mcg/act Aers (Albuterol sulfate) .Marland Kitchen... 2 puffs qid    Avelox 400 Mg Tabs (Moxifloxacin hcl) .Marland Kitchen... 1 by mouth once daily  Orders: T-2 View CXR (71020TC) Nebulizer Tx (16109)  Take antibiotics and other medications as directed. Encouraged to push clear liquids, get enough rest, and take acetaminophen as needed. To be seen in 5-7 days if no improvement, sooner if worse.  Problem # 2:  OBSTRUCTIVE CHRONIC BRONCHITIS WITH EXACERBATION (ICD-491.21)  Orders: Nebulizer Tx (60454)  Complete Medication List: 1)  Aricept 10 Mg Tabs (Donepezil hydrochloride) .... Take one tablet daily 2)  Norvasc 5 Mg Tabs (Amlodipine besylate) .Marland Kitchen.. 1 by mouth qd 3)  Levothyroxine Sodium 88 Mcg Tabs (Levothyroxine sodium) .Marland Kitchen.. 1 by mouth once daily. 4)  Pravachol 80 Mg Tabs (Pravastatin sodium) .Marland Kitchen.. 1 by mouth at bedtime, 5)  Namenda 10 Mg Tabs (Memantine hcl) .Marland Kitchen.. 1 by mouth two times a day 6)  Antivert 25 Mg Tabs (Meclizine hcl) .Marland Kitchen.. 1 by mouth qid as needed 7)  Nebulizer Compressor Kit (Respiratory therapy supplies) .... As directed 8)  Ipratropium-albuterol 0.5-2.5 As 3 Mg/3cc  .Marland Kitchen.. 1 ampule every 6 hrs as needed 9)  Symbicort 160-4.5 Mcg/act Aero (Budesonide-formoterol fumarate) .... 2 puffs two times a day 10)  Proair Hfa 108 (90 Base) Mcg/act Aers (Albuterol sulfate) .... 2 puffs qid 11)  Vitamin D (ergocalciferol) 50000 Unit Caps (Ergocalciferol) .Marland Kitchen.. 1 by mouth once weekly x12 weeks 12)  Aspirin 81 Mg Tabs (Aspirin) .Marland Kitchen.. 1 by mouth once daily 13)  Avelox 400 Mg Tabs (Moxifloxacin hcl) .Marland Kitchen.. 1 by mouth once daily 14)  Prednisone 10 Mg Tabs (Prednisone) .... 3 by mouth once daily for 3 days then 2 by mouth once daily for 3 days then 1 by mouth once daily for 3 days  Patient Instructions: 1)  Please schedule a follow-up appointment in 2 weeks-- or sooner as needed  Prescriptions: AVELOX 400 MG TABS (MOXIFLOXACIN HCL) 1 by mouth once daily   #10 x 0   Entered and Authorized by:   Loreen Freud DO   Signed by:   Loreen Freud DO on 11/09/2010   Method used:   Print then Give to Patient   RxID:   [id number] PREDNISONE 10 MG TABS (PREDNISONE) 3 by mouth once daily for 3 days then 2 by mouth once daily for 3 days then 1 by mouth once daily for 3 days  #18 x 0   Entered and Authorized by:   Loreen Freud DO   Signed by:   Loreen Freud DO on 11/09/2010   Method used:   Print then Give to Patient   RxID:   [id number]    Orders Added: 1)  T-2 View CXR [71020TC] 2)  Est. Patient Level IV [41324] 3)  Nebulizer Tx [40102]  Appended Document: Orders Update     Clinical Lists Changes  Orders: Added new Service order of Depo- Medrol 80mg  (J1040) - Signed Added new Service order of Admin  of Therapeutic Inj  intramuscular or subcutaneous (11914) - Signed       Medication Administration  Injection # 1:    Medication: Depo- Medrol 80mg     Diagnosis: BRONCHITIS, ACUTE WITH MILD BRONCHOSPASM (ICD-466.0)    Route: IM    Site: LUOQ gluteus    Exp Date: 05/08/2011    Lot #: [id number]    Mfr: Pharmacia    Patient tolerated injection without complications    Given by: Jeremy Johann CMA (November 09, 2010 12:18 PM)  Orders Added: 1)  Depo- Medrol 80mg  [J1040] 2)  Admin of Therapeutic Inj  intramuscular or subcutaneous [96372]  Appended Document: Orders Update     Clinical Lists Changes  Orders: Added new Service order of Albuterol Sulfate Sol 1mg  unit dose (N8295) - Signed Added new Service order of Ipratropium inhalation sol. unit dose (A2130) - Signed Added new Service order of Nebulizer Tx (86578) - Signed       Medication Administration  Medication # 1:    Medication: Albuterol Sulfate Sol 1mg  unit dose    Diagnosis: BRONCHITIS, ACUTE WITH MILD BRONCHOSPASM (ICD-466.0)    Dose: 2.5mg /26ml    Route: inhaled    Exp Date: 05/07/2012    Lot #: [id number]    Mfr: nephron    Patient tolerated medication  without complications    Given by: Almeta Monas CMA Duncan Dull) (November 09, 2010 1:53 PM)  Medication # 2:    Medication: Ipratropium inhalation sol. unit dose    Diagnosis: BRONCHITIS, ACUTE WITH MILD BRONCHOSPASM (ICD-466.0)    Dose: 0.5mg /2.98ml    Route: inhaled    Exp Date: 08/08/2011    Lot #: [id number]    Mfr: nephron    Patient tolerated medication without complications    Given by: Almeta Monas CMA Duncan Dull) (November 09, 2010 1:54 PM)  Orders Added: 1)  Albuterol Sulfate Sol 1mg  unit dose [K4401] 2)  Ipratropium inhalation sol. unit dose [J7644] 3)  Nebulizer Tx [phone]

## 2010-12-09 NOTE — Miscellaneous (Signed)
Summary: Residency Orders/[facility]  Residency Orders/[facility]   Imported By: Lanelle Bal 11/04/2010 09:10:40  _____________________________________________________________________  External Attachment:    Type:   Image     Comment:   External Document

## 2010-12-09 NOTE — Miscellaneous (Signed)
Summary: Diet Order/[facility]  Diet Order/[facility]   Imported By: Lanelle Bal 11/25/2010 12:11:49  _____________________________________________________________________  External Attachment:    Type:   Image     Comment:   External Document

## 2010-12-10 NOTE — Medication Information (Signed)
Summary: Letter Regarding Synthroid & Drug Formulary/[employer]  Letter Regarding Synthroid & Drug Formulary/[employer]   Imported By: Lanelle Bal 11/24/2009 14:09:30  _____________________________________________________________________  External Attachment:    Type:   Image     Comment:   External Document

## 2010-12-15 NOTE — Miscellaneous (Signed)
Summary: Care Plan/[facility] Plan/[facility]   Imported By: Lanelle Bal 12/07/2010 10:30:12  _____________________________________________________________________  External Attachment:    Type:   Image     Comment:   External Document

## 2010-12-23 NOTE — Miscellaneous (Signed)
Summary: Resident Care Plan/[facility]  Resident Care [facility]   Imported By: Maryln Gottron 12/14/2010 15:54:15  _____________________________________________________________________  External Attachment:    Type:   Image     Comment:   External Document

## 2011-01-12 ENCOUNTER — Encounter: Payer: Self-pay | Admitting: Family Medicine

## 2011-01-25 NOTE — Miscellaneous (Signed)
Summary: Report of Weight Gain/[facility]  Report of Weight Gain/[facility]   Imported By: Maryln Gottron 01/20/2011 12:29:31  _____________________________________________________________________  External Attachment:    Type:   Image     Comment:   External Document

## 2011-03-22 NOTE — H&P (Signed)
NAMEBRENNAH, [name]               ACCOUNT NO.:  [id number]   MEDICAL RECORD NO.:  [id number]          PATIENT TYPE:  INP   LOCATION:  0102                         FACILITY:  [facility]   PHYSICIAN:  Georgina Quint. Plotnikov, MDDATE OF BIRTH:  31-Jul-1928   DATE OF ADMISSION:  09/14/2008  DATE OF DISCHARGE:                              HISTORY & PHYSICAL   CHIEF COMPLAINT:  Abdominal pain.   HISTORY OF PRESENT ILLNESS:  The patient is a 77 year old female who  developed severe mid abdominal pain with sensation of heat earlier this  morning during church service.  They proceeded to the emergency room  where the pain became excruciating, 10 out of 10 in intensity, in the  midabdomen and radiating to the back, she was extremely uncomfortable.  She received 2 mg of morphine which made the pain go away.  There was no  nausea, vomiting or diarrhea.  No chest pain or syncope associated with  it.  Now she is complaining of feeling cold.   PAST MEDICAL HISTORY:  1. Gallbladder disease.  She was seeing Dr. Juanda Chance in 2006-2007, had a      HIDA scan and was recommended against gallbladder surgery at the      time.  2. History of breast cancer.  3. Hypertension.  4. Hypothyroidism.   SURGICAL HISTORY:  Left mastectomy.   SOCIAL HISTORY:  She lives in a retirement community.   FAMILY HISTORY:  Positive for high blood pressure.   CURRENT MEDICINES:  Aricept, Namenda, Synthroid, Evista, Norvasc.   ALLERGIES:  None.   REVIEW OF SYSTEMS:  Obtained from the family.  The last episode of  similar abdominal pain in 2007, chronic low back pain from spinal  stenosis, no syncope.  The rest of the 18-point review of systems is as  above or negative.   PHYSICAL:  Blood pressure 86/42 on admission, later 105/64, heart rate  80 on admission, later 62, later 48 during sleep.  Temperature 97.5.  Sats 96% on room air.  She is in no acute distress.  HEENT: Moist mucosa.  No jaundice.  NECK:  Supple.  LUNGS:   Clear, no wheezes.  HEART:  S1-S2, bradycardic, grade 2/6 systolic murmur.  ABDOMEN:  Nondistended, soft, nontender, bowel sounds present.  CALVES:  Nontender.  LOWER EXTREMITIES:  Without edema.  She is alert and cooperative.  Cranial nerves II-XII nonfocal.  Deep tendinous muscle strength within  normal limits.  Pulse is normal and symmetric.  RECTAL:  Per emergency room physician.   LABS:  White count 7.8, hemoglobin 13.1, platelets 208, INR 1.0, sodium  141, potassium 3.6, glucose 109, BUN 13, creatinine 0.67, ALT, AST  normal, lipase 28.  Urinalysis normal.  Chest x-ray with COPD.  EKG with  sinus bradycardia.  Telemetry with a heart rate of 48.  CT of the  abdomen and pelvis with cholelithiasis without cholecystitis,  calcification in the pancreatic tail, old compression fracture.   ASSESSMENT/PLAN:  1. Abdominal pain, likely due to problem number 2.  Will start      antibiotics if spikes a fever.  Status post  surgical consultation.  2. Gallbladder disease, chronic.  Will obtain a HIDA scan, advance      diet.  3. Pancreatic tail calcifications.  4. Alzheimer's disease, on therapy.  5. Old compression fractures.  6. Hypotension.  7. Bradycardia, improved, both likely due to vagal reaction.  8. Hypothyroidism, on replacement therapy.  9. Deep vein thrombosis prophylaxis.      Georgina Quint. Plotnikov, MD  Electronically Signed     AVP/MEDQ  D:  09/14/2008  T:  09/14/2008  Job:  [id number]   cc:   Lelon Perla, DO  [address] [place], [place] [address]

## 2011-03-22 NOTE — Discharge Summary (Signed)
[name], [name]               ACCOUNT NO.:  [id number]   MEDICAL RECORD NO.:  [id number]          PATIENT TYPE:  INP   LOCATION:  1422                         [facility]ACILITY:  [facility]   PHYSICIAN:  Rosalyn Gess. Norins, MD  DATE OF BIRTH:  1928-02-03   DATE OF ADMISSION:  09/14/2008  DATE OF DISCHARGE:  09/15/2008                               DISCHARGE SUMMARY   ADMITTING DIAGNOSIS:  Abdominal pain, acute.   DISCHARGE DIAGNOSIS:  Abdominal pain, resolved.   CONSULTANTS:  Alfonse Ras, MD for General Surgery.   PROCEDURES:  1. Acute abdominal series with a negative chest x-ray with stable      COPD.  2. CT of the abdomen and pelvis which revealed cholelithiasis without      evidence of cholecystitis.  Calcifications in the pancreatic tail.      Lumbar and thoracic spine degenerative changes with old compression      fracture..  No abdominal aortic aneurysm or evidence of aortic      rupture.  CT of the pelvis was normal.  3. Hepatobiliary scan performed September 15, 2008 read out as a normal      examination.   HISTORY OF PRESENT ILLNESS:  The patient is a 77 year old woman who has  known cholelithiasis who had the acute onset of abdominal pain while at  church.  She was brought to the emergency department because of  excruciating pain.  She did receive 2 mg of morphine which relieved her  discomfort.  There was no nausea, vomiting or diarrhea.  She had no  chest pain.  She was hypotensive in the ER and subsequently was admitted  to hospital.  Please see the H and P as well as Dr. Tami Lin consultation  note for past medical history, family history, social history and  physical exam at admission.   HOSPITAL COURSE:  The patient was admitted to a telemetry floor.  Of  note her abdominal pain and discomfort were improving while in the  emergency department and she has had no recurrent pain.  The patient had  studies as noted above including a normal hepatic biliary scan.  It was  felt that she was stable and not needing any surgical intervention.  The  patient's laboratory studies included a CBC at admission which was  unremarkable with a white count of 7800, INR was 1.0 and normal.  Comprehensive metabolic panel revealed a glucose of 109, creatinine  0.67, otherwise normal.  Lipase was normal at 28.  Urinalysis was  negative.  Lactic acid venous was normal at 1.8.  Followup laboratory  November 9 showed white count remaining normal at 8000 with normal  hemoglobin.  Comprehensive metabolic panel remained normal except  glucose was now up to 118, lipase was normal at 18.   With the patient's symptoms having resolved with all of her studies  being normal at this time, she is thought to be stable and ready for  discharge home.   DISCHARGE EXAMINATION:  Temperature of 98.2, blood pressure was 110/55,  heart rate 61, respirations were 16, O2 saturations 97% on room air.  GENERAL APPEARANCE:  This is a pleasant woman lying in bed who is in no  acute distress.  HEENT:  Exam was grossly normal.  CHEST:  Was clear.  CARDIOVASCULAR:  Exam with a quiet precordium and a regular rate and  rhythm.  ABDOMEN:  Soft with positive bowel sounds.  No tenderness to palpation.   DISPOSITION:  The patient is discharged home.  She will continue on all  of her home medications with no change.  She should follow up with her  primary care physician Dr. Laury Axon on an as needed basis.  The patient's  condition at time of this dictation is stable and improved.      Rosalyn Gess Norins, MD  Electronically Signed     MEN/MEDQ  D:  09/15/2008  T:  09/15/2008  Job:  [id number]   cc:   Alfonse Ras, MD  [address]  [place]  [place] [address]   Lelon Perla, DO  [address] [place], [place] [address]

## 2011-03-22 NOTE — Consult Note (Signed)
[name], [name]               ACCOUNT NO.:  [id number]   MEDICAL RECORD NO.:  [id number]          PATIENT TYPE:  EMS   LOCATION:  ED                           FACILITY:  [facility]   PHYSICIAN:  Alfonse Ras, MD   DATE OF BIRTH:  May 10, 1928   DATE OF CONSULTATION:  DATE OF DISCHARGE:                                 CONSULTATION   REASON FOR CONSULTATION:  By Dr. Rito Ehrlich, abdominal pain.   HISTORY OF PRESENT ILLNESS:  The patient is a 77 year old white female  with a history of upper abdominal pain, chronically over the last few  years and worked up by Dr. Juanda Chance.  HIDA scan in 2007 showed an EF of  41% with outward duration of her symptoms at that time.  She this  morning started with acute onset of abdominal pain at church and was  brought to the [facility] emergency room.  By Dr. Rito Ehrlich exam, she was  writhing in pain and was hypotensive with a blood pressure in the upper  80s.  He emergently took her to the CAT scanner which showed no evidence  of AAA rupture, showed a dilated gallbladder without gallbladder wall  thickening and cholelithiasis.  No evidence of acute cholecystitis, and  otherwise a completely normal CT scan, although this was done without  p.o. or IV contrast.  The patient responded well to IV fluid hydration  and received only 2 mg of Morphine.  On my arrival, which was within  about 30 minutes of my consultation, the patient was pain-free, except  for which she states is her chronic back pain.  She denies any abdominal  pain.   PAST MEDICAL HISTORY:  1. Significant for breast cancer, requiring a left modified radical      mastectomy in 2003.  2. Hypertension.  3. Questionable atrial fibrillation.  4. Alzheimer disease.   PAST SURGICAL HISTORY:  1. Significant for left coronary endarterectomy in 1999.  2. Total abdominal hysterectomy in 1985.  3. Left modified radical mastectomy in 2003.   MEDICATIONS:  1. Aricept.  2. Synthroid.  3. Norvasc.   PHYSICAL EXAMINATION:  VITAL SIGNS:  On my examination, blood pressure  is 105/64, heart rate is 62, and respiratory rate is 16.  GENERAL:  She appears in no distress.  I had her lay on her back without  any difficulty.  NECK:  A well-healed left carotid endarterectomy scar.  I hear no  bruits.  LUNGS:  Clear to auscultation and percussion x2.  HEART:  Regular rate and rhythm without murmurs, rubs, or gallops at  this time.  ABDOMEN:  Soft, minimally tender in the right upper quadrant, fairly  deep palpation.  Otherwise, the abdomen was completely soft with normal  bowel sounds.  EXTREMITIES:  No clubbing, cyanosis, or edema.   LABORATORY DATA:  White count 7.8.  C-Mat is completely within normal  limits including all LFTs and bicarb.   IMPRESSION:  Abdominal pain, resolving here in the emergency room, could  be possible cholecystitis, although the CT scan is not consistent.  I  have recommend a HIDA  scan and admission because of her hypertension, IV  fluid hydration.  [facility] will be admitting the patient, and  I will continue followup.      Alfonse Ras, MD  Electronically Signed     KRE/MEDQ  D:  09/14/2008  T:  09/14/2008  Job:  [id number]   cc:   Thomas C. Wall, MD, FACC  [address]  [place]  [place] [address]   Lelon Perla, DO  [address] [place], [place] [address]

## 2011-03-25 NOTE — Assessment & Plan Note (Signed)
[facility]                              CARDIOLOGY OFFICE NOTE   NAME:[name], [name]                      MRN:          [id number]  DATE:09/07/2006                            DOB:          1928-09-24    Ms. Shuping returns today for further management of her nonobstructive  coronary artery disease.  I last saw her on September 21, 2005.  At that  time, she was complaining of some shortness of breath.  We performed an  adenosine Myoview which demonstrated an EF of 79% with very minimal ischemic  inferior wall.  It was also possibly consistent with diaphragmatic  attenuation.  There were no diagnostic ST segment changes.  Wall function  was normal.  We felt it was a low risk study and continued medical therapy.   We cleared her for back surgery with Dr. Darrelyn Hillock.  Unfortunately, she is  still having a lot of back and hip pain.  She is seeing him, I think, today  or tomorrow.   MEDICATIONS:  1. Synthroid 0.088 mcg daily.  2. Pravachol 40 mg p.o. daily.  3. Aspirin 81 mg a day.  4. Metamucil daily.  5. Aricept 10 mg a day.  6. Protonix 40 mg a day.   PHYSICAL EXAMINATION:  VITAL SIGNS:  Blood pressure 134/98, pulse 88 and  regular.  Weight 149.  HEENT:  Negative, unremarkable.  SKIN:  Warm and dry.  NECK:  Carotid upstrokes are equal bilaterally without bruits.  No JVD.  Thyroid not enlarged.  Trachea midline.  LUNGS:  Clear to auscultation and percussion.  HEART:  Soft S1, S2 without murmurs, rubs or gallops.  ABDOMEN:  Soft, good bowel sounds.  There is no distention.  EXTREMITIES:  Trace edema.  Pulses are 1+/4+ bilaterally and symmetrical.  NEUROLOGIC:  Intact.   EKG shows sinus rhythm with biatrial enlargement with a pulmonary disease  component with a right bundle branch block pattern.  This is unchanged.  Her  rate is about the same.   ASSESSMENT/PLAN:  I think Ms. Montelongo is doing well from a cardiovascular  standpoint.  I have  asked her to continue with her aspirin and Pravachol and  followup with Dr. Susann Givens.  We will see her back in 1 year.     Thomas C. Daleen Squibb, MD, [facility]  Electronically Signed    TCW/MedQ  DD: 09/07/2006  DT: 09/07/2006  Job #: [id number]   cc:   Sharlot Gowda, M.D.

## 2011-03-25 NOTE — Op Note (Signed)
   NAME:  KIERRE, DEINES                         ACCOUNT NO.:  [id number]   MEDICAL RECORD NO.:  [id number]                   PATIENT TYPE:  AMB   LOCATION:  DSC                                  FACILITY:  [facility]   PHYSICIAN:  Currie Paris, M.D.           DATE OF BIRTH:  1928-02-20   DATE OF PROCEDURE:  DATE OF DISCHARGE:                                 OPERATIVE REPORT   OFFICE MEDICAL RECORD NUMBER:  [id number].   PREOPERATIVE DIAGNOSIS:  Un-needed Port-A-Cath.   POSTOPERATIVE DIAGNOSIS:  Un-needed Port-A-Cath.   OPERATION:  Removal of Port-A-Cath.   SURGEON:  Dr. Jamey Ripa.   ANESTHESIA:  Local.   CLINICAL HISTORY:  Ms. Romaniello is a 77 year old who has finished her  chemotherapy and no longer needs IV access.   DESCRIPTION OF PROCEDURE:  The patient was seen in the minor procedure room  and had no further questions.  The area over the port was prepped and then  anesthetized with about 10 cc of 1% Xylocaine with epinephrine.  I gave that  about ten minutes for good vasoconstriction and then made a transverse  incision in the old scar.  The Port-A-Cath tubing was immediately identified  and backed out a little way so I could see the tunnel and tracked.  A figure-  of-eight 4-0 Vicryl was put around the track and the catheter backed  completely out and the suture tied down.  Two holding sutures of Prolene  holding the export in place were cut and the capsule freed up a little bit  and the export removed.   Everything appeared to be dry so the incision was closed with 4-0 Vicryl  followed by 4-0 Monocryl subcuticular and Steri-Strips.  The patient  tolerated the procedure well.  There were no operative complications and all  counts were correct.                                               Currie Paris, M.D.    CJS/MEDQ  D:  12/04/2002  T:  12/04/2002  Job:  [id number]

## 2011-06-14 ENCOUNTER — Telehealth: Payer: Self-pay | Admitting: *Deleted

## 2011-06-14 NOTE — Telephone Encounter (Signed)
Patient's last colonoscopy was completed 05/04/2005 due to history of adenomatous colon polyps and family history of colon cancer in a brother. Patient is now overdue for recall colonoscopy. However, patient would first need an office visit due to age and mental status (has alzheimer's disease per office notes). I have spoken with patient's daughter who is also her caregiver. She states that her mother was recently put in the memory care unit at a facility and her mother is not having any symptoms. Therefore, she would like to hold off on scheduling a procedure for now.

## 2011-06-28 ENCOUNTER — Telehealth: Payer: Self-pay | Admitting: *Deleted

## 2011-06-28 NOTE — Telephone Encounter (Signed)
If pain does not subside with tramadol she should go to ER.  Headache should not be constant like that.  If she does get relief and it comes back she can make ov.

## 2011-06-28 NOTE — Telephone Encounter (Signed)
Nurse tech aware.

## 2011-06-28 NOTE — Telephone Encounter (Signed)
Pt c/o pain in neck, back and some tremors. Pt initial complaint was at 9:45 am when Pt was given a tramadol but still complained of pain 1 hour later when check. Pt has also been c/o on going headache as well. Pt is about to received second tramadol for today after still c/o pain throughout the day. Nurse tech notes that Pt tremors have resolved but pt still c/o neck and back pain.Please advise

## 2011-06-30 ENCOUNTER — Telehealth: Payer: Self-pay

## 2011-06-30 ENCOUNTER — Ambulatory Visit
Admission: RE | Admit: 2011-06-30 | Discharge: 2011-06-30 | Disposition: A | Discharge status: Home / Self Care | Payer: Medicare Other | Source: Ambulatory Visit | Attending: Neurology | Admitting: Neurology

## 2011-06-30 ENCOUNTER — Ambulatory Visit: Payer: Self-pay | Admitting: Family Medicine

## 2011-06-30 ENCOUNTER — Other Ambulatory Visit: Payer: Self-pay | Admitting: Neurology

## 2011-06-30 DIAGNOSIS — R269 Unspecified abnormalities of gait and mobility: Secondary | ICD-10-CM

## 2011-06-30 DIAGNOSIS — M47812 Spondylosis without myelopathy or radiculopathy, cervical region: Secondary | ICD-10-CM

## 2011-06-30 DIAGNOSIS — R51 Headache: Secondary | ICD-10-CM

## 2011-06-30 NOTE — Telephone Encounter (Signed)
Called to check patient status and Daughter advised that patient was seen by Dr.Willis today and she is now back at the assisted living facility and patient is doing better. I advised I would document     KP

## 2011-07-13 ENCOUNTER — Other Ambulatory Visit: Payer: Self-pay | Admitting: Neurology

## 2011-07-13 DIAGNOSIS — R51 Headache: Secondary | ICD-10-CM

## 2011-07-13 DIAGNOSIS — R42 Dizziness and giddiness: Secondary | ICD-10-CM

## 2011-07-20 ENCOUNTER — Ambulatory Visit
Admission: RE | Admit: 2011-07-20 | Discharge: 2011-07-20 | Disposition: A | Discharge status: Home / Self Care | Payer: Medicare Other | Source: Ambulatory Visit | Attending: Neurology | Admitting: Neurology

## 2011-07-20 DIAGNOSIS — R51 Headache: Secondary | ICD-10-CM

## 2011-07-20 DIAGNOSIS — R42 Dizziness and giddiness: Secondary | ICD-10-CM

## 2011-08-09 LAB — COMPREHENSIVE METABOLIC PANEL
ALT: 28
AST: 28
Albumin: 3.1 — ABNORMAL LOW
Albumin: 3.5
Alkaline Phosphatase: 38 — ABNORMAL LOW
BUN: 13
BUN: 9
CO2: 30
Calcium: 8.8
Chloride: 105
Chloride: 107
Creatinine, Ser: 0.63
Creatinine, Ser: 0.67
GFR calc Af Amer: >60
GFR calc non Af Amer: >60
GFR calc non Af Amer: >60
Glucose, Bld: 118 — ABNORMAL HIGH
Potassium: 3.5
Sodium: 142
Total Bilirubin: 0.8
Total Bilirubin: 0.8
Total Protein: 5.1 — ABNORMAL LOW

## 2011-08-09 LAB — CBC
HCT: 37.1
HCT: 39.4
Hemoglobin: 12.8
MCHC: 34.4
MCV: 89.5
Platelets: 208
RDW: 13.3
WBC: 7.8

## 2011-08-09 LAB — URINALYSIS, ROUTINE W REFLEX MICROSCOPIC
Bilirubin Urine: NEGATIVE
Glucose, UA: NEGATIVE
Hgb urine dipstick: NEGATIVE
Protein, ur: NEGATIVE

## 2011-08-09 LAB — DIFFERENTIAL
Basophils Absolute: 0
Lymphocytes Relative: 15
Monocytes Absolute: 0.9
Neutro Abs: 5.7

## 2011-08-09 LAB — LACTIC ACID, PLASMA: Lactic Acid, Venous: 1.8

## 2011-08-09 LAB — PROTIME-INR
INR: 1
Prothrombin Time: 13.5

## 2011-08-09 LAB — LIPASE, BLOOD
Lipase: 18
Lipase: 28

## 2011-08-09 LAB — APTT: aPTT: 22 — ABNORMAL LOW

## 2011-10-31 ENCOUNTER — Ambulatory Visit (INDEPENDENT_AMBULATORY_CARE_PROVIDER_SITE_OTHER): Payer: Medicare Other | Admitting: Internal Medicine

## 2011-10-31 ENCOUNTER — Encounter: Payer: Self-pay | Admitting: Internal Medicine

## 2011-10-31 VITALS — BP 116/64 | HR 90 | Temp 97.6°F | Wt 149.0 lb

## 2011-10-31 DIAGNOSIS — N390 Urinary tract infection, site not specified: Secondary | ICD-10-CM

## 2011-10-31 DIAGNOSIS — R630 Anorexia: Secondary | ICD-10-CM

## 2011-10-31 DIAGNOSIS — J449 Chronic obstructive pulmonary disease, unspecified: Secondary | ICD-10-CM | POA: Insufficient documentation

## 2011-10-31 DIAGNOSIS — J209 Acute bronchitis, unspecified: Secondary | ICD-10-CM

## 2011-10-31 LAB — POCT URINALYSIS DIPSTICK
Bilirubin, UA: NEGATIVE
Glucose, UA: NEGATIVE
Nitrite, UA: NEGATIVE
pH, UA: 6.5

## 2011-10-31 MED ORDER — AZITHROMYCIN 250 MG PO TABS: ORAL_TABLET | ORAL | Status: AC

## 2011-10-31 NOTE — Patient Instructions (Signed)
Continue the nebulizer treatments every 4-6 hours as needed for paroxysmal cough or shortness of breath. Encourage oral fluids up to 40 ounces a day of thin liquids.

## 2011-10-31 NOTE — Progress Notes (Signed)
Addended by: Candie Echevaria L on: 10/31/2011 12:50 PM   Modules accepted: Orders

## 2011-10-31 NOTE — Progress Notes (Signed)
  Subjective:    Patient ID: [name], female    DOB: Feb 20, 1928, 77 y.o.   MRN: [id number]  HPI Respiratory tract infection Onset/symptoms:7 days ago as rattly breathing Exposures (illness/environmental/extrinsic):she lives in NH Progression of symptoms:decreased oral intake Treatments/response:Mucinex, neb treatments with ?some benefit Present symptoms: Fever/chills/sweats:no Frontal headache:no Facial pain:no Nasal purulence:no Sore throat:no Lymphadenopathy:no Wheezing/shortness of breath:yes & SOB Cough/sputum/hemoptysis:sputum not visualized Associated extrinsic/allergic symptoms:itchy eyes/ sneezing:no Past medical history: COPD Smoking history:quit 2000           Review of Systems no genitourinary symptoms such as hematuria, pyuria, or dysuria have been reported.     Objective:   Physical Exam General appearance : adequate nourishment; no acute distress or increased work of breathing is present.  No  lymphadenopathy about the head, neck, or axilla noted.   Eyes: No conjunctival inflammation or lid edema is present.   Ears:  External ear exam shows no significant lesions or deformities.  Otoscopic examination reveals clear canals, tympanic membranes are intact bilaterally without bulging, retraction, inflammation or discharge.  Nose:  External nasal examination shows no deformity or inflammation. Nasal mucosa are dry without lesions or exudates. No septal dislocation .No obstruction to airflow.   Oral exam: Dentures; lips and gums are healthy appearing.There is no oropharyngeal erythema or exudate noted.      Heart:  Normal rate and regular rhythm. S1 and S2 normal without gallop, murmur, click, rub S 4  Lungs: Initially, she exhibited low grade rolls in the right midlung field and minimally on the left a similar distribution.With inspiratory maneuvers; these findings resolved suggestion they would do to airway secretions pneumonitis. No increased work of  breathing.  Abdomen reveals normal bowel sounds without tenderness or ileus.   Extremities:  No cyanosis or  clubbing  noted . She has 1+ pitting edema to the lower shins   Skin: Warm & dry w/o jaundice or tenting  Neuro: Affect is flat but pleasant. She is unable to give me today's date or it'ssignificance.         Assessment & Plan:   #1 bronchitis, acute without respiratory compromise despite history of COPD. Clinically pneumonia is not suspected.  #2 anorexia related to #1; urine will be checked for culture and sensitivity if possible. She is unable to give me a history to rule out urinary tract infection.

## 2011-11-02 LAB — URINE CULTURE

## 2011-11-07 ENCOUNTER — Telehealth: Payer: Self-pay | Admitting: *Deleted

## 2011-11-07 ENCOUNTER — Ambulatory Visit: Payer: Medicare Other | Admitting: Internal Medicine

## 2011-11-07 ENCOUNTER — Encounter: Payer: Self-pay | Admitting: Internal Medicine

## 2011-11-07 ENCOUNTER — Ambulatory Visit: Payer: Medicare Other

## 2011-11-07 ENCOUNTER — Ambulatory Visit (INDEPENDENT_AMBULATORY_CARE_PROVIDER_SITE_OTHER): Payer: Medicare Other | Admitting: Internal Medicine

## 2011-11-07 ENCOUNTER — Ambulatory Visit: Payer: Medicare Other | Admitting: Family Medicine

## 2011-11-07 DIAGNOSIS — E039 Hypothyroidism, unspecified: Secondary | ICD-10-CM

## 2011-11-07 DIAGNOSIS — E559 Vitamin D deficiency, unspecified: Secondary | ICD-10-CM

## 2011-11-07 DIAGNOSIS — R5383 Other fatigue: Secondary | ICD-10-CM

## 2011-11-07 DIAGNOSIS — F068 Other specified mental disorders due to known physiological condition: Secondary | ICD-10-CM

## 2011-11-07 DIAGNOSIS — R630 Anorexia: Secondary | ICD-10-CM

## 2011-11-07 DIAGNOSIS — R509 Fever, unspecified: Secondary | ICD-10-CM

## 2011-11-07 DIAGNOSIS — R531 Weakness: Secondary | ICD-10-CM

## 2011-11-07 LAB — BASIC METABOLIC PANEL
BUN: 22 mg/dL (ref 6–23)
CO2: 29 mEq/L (ref 19–32)
Calcium: 9.1 mg/dL (ref 8.4–10.5)
Creatinine, Ser: 0.8 mg/dL (ref 0.4–1.2)
Glucose, Bld: 110 mg/dL — ABNORMAL HIGH (ref 70–99)

## 2011-11-07 LAB — CBC WITH DIFFERENTIAL/PLATELET
Eosinophils Relative: 0.3 % (ref 0.0–5.0)
HCT: 43.8 % (ref 36.0–46.0)
Hemoglobin: 14.7 g/dL (ref 12.0–15.0)
Lymphs Abs: 0.9 10*3/uL (ref 0.7–4.0)
Monocytes Relative: 6.6 % (ref 3.0–12.0)
Neutro Abs: 8.3 10*3/uL — ABNORMAL HIGH (ref 1.4–7.7)
RDW: 13.7 % (ref 11.5–14.6)

## 2011-11-07 LAB — POCT URINALYSIS DIPSTICK
Blood, UA: NEGATIVE
Glucose, UA: NEGATIVE
Spec Grav, UA: 1.02
Urobilinogen, UA: 0.2

## 2011-11-07 LAB — AST: AST: 27 U/L (ref 0–37)

## 2011-11-07 NOTE — Telephone Encounter (Signed)
Apologized for any problems since Dr. Laury Axon is out of the office.  Talked with POA daughter who advised she would not take patient to the ED, as recommended by both Dr. Beverely Low and Dr. Alwyn Ren.  Daughter advised she couldn't make the scheduler understand the cough was better.  She refused 2:15 appt with Dr. Beverely Low, and I advised she could see Dr. Alwyn Ren at 11:30 to which she agreed.  Call took place around 10:15 a.m.

## 2011-11-07 NOTE — Patient Instructions (Addendum)
I recommend a consultation with Dr Anne Hahn if symptoms persist or progress & labs are non diagnostic.

## 2011-11-07 NOTE — Assessment & Plan Note (Signed)
Vitamin D level was 22 in December 2011

## 2011-11-07 NOTE — Telephone Encounter (Signed)
Received call from [facility] to advise orders need to be changed to future order per unable to attain, MD Tabori fixed orders in system,per Dorette Grate with MD Alwyn Ren and [place] lab tech Synetta Fail verified the orders are now correct and able to collect

## 2011-11-07 NOTE — Assessment & Plan Note (Signed)
Most recent TSH was 2.13 in May 2011

## 2011-11-07 NOTE — Telephone Encounter (Signed)
Based on sxs daughter is reporting- inability to walk, poor intake- pt needs ER evaluation.  Spoke w/ office manager about fact that daughter is upset and saying we're refusing to see her (which we're not).  She spoke w/ Dr Alwyn Ren who saw pt previously and they will come see him earlier today for evaluation and tx.

## 2011-11-07 NOTE — Telephone Encounter (Signed)
Called to speak to pt about s/s per noted possible need to go to [facility] Room per MD Tabori advised to contact to find out more information in order to triage need for ER or Office Visit set up for today at 2:15pm, daughter Harriett Sine advised that she can speak to me based on her mothers s/s per noted she brought mother in office on 10-31-11 was advised by MD Alwyn Ren that she had  Bronchitis and finished z-pack on Saturday night and pt received results from lab in mail noted to say:  If not back to baseline after antibiotic; reculture the urine. Criteria for a significant Urinary Tract Infection (UTI) are: over 100,000 colonies of a single, not multiple bacteria.  Per MD Alwyn Ren Daughter advised that pt has Alzheimer's and can not tell her if anything is hurting and currently is unable to walk and will not eat she has had the rattling in her chest for 2-3 weeks however the rattling is somewhat better but now has the added symptoms of unable to walk and not eating with continued rattling in her chest, placed daughter on hold after permission given to do so, spoke to MD Tabori and was advised based on current symptoms the pt needs to be seen in the ER, pt daughter stated "I will not take her mother to the ER that is ridiculous, because taking her mother to the ER is like taking an infant who can walk and there is too many germs there for her mother and she has to wait way too long there and if we can not see her that she will just wait til MD Laury Axon gets back and if her mother gets sicker it will be on [place]! " I advised that we did not refuse to see her mother that we only wanted to advise that she would have more options at the ER based on current s/s she may need further testing that is not seen in the office but that we would still see her mother and that I could speak to MD Alwyn Ren based on the fact that he saw her last, placed pt daughter on hold with her permission, unable to locate MD Alwyn Ren at this moment went back  to pt daughter to update my progress, daughter advised I will just wait til Wednesday and hung up on me. Spoke to MD Beverely Low based on concerns and manager is aware.

## 2011-11-07 NOTE — Progress Notes (Signed)
Subjective:    Patient ID: [name], female    DOB: Jun 20, 1928, 77 y.o.   MRN: [id number]  HPI WEAK . Note: history from daughters Onset: 2 weeks Character: stays in bed unless mobilized by NH staff & anorexia   Fatigue;" I need to be in the bed"  Symptoms: Fever/ chills : yes, slight fever several days ago  Night sweats: no                                                                                            Vision changes ( blurred/ double/ loss): no                                                                                                  Hoarseness or swallowing dysfunction: no                                                                                         Bowel changes( constipation/ diarrhea): no                                                                                     Weight change: yes, as per daughters   Exertional chest pain: no  Dyspnea on exertion: yes , "even standing up' Cough: no  New medications: no  Leg swelling: yes, chronic , usually bilateral  PND: no  Melena/ rectal bleeding: no  Adenopathy: no  Severe snoring: no  Daytime sleepiness: yes  Skin / hair / nail changes: no  Temperature intolerance( heat/ cold) : "cold a Ali" as per daughters                                                           Feeling depressed: yes, daughter questions  Anhedonia: yes , not participating in activities Poor sleep/ Apnea : no  Abnormal bruising / bleeding  or enlarged lymph nodes: bruises easily                                                                         Review of Systems     Objective:   Physical Exam Gen.: Healthy and well-nourished in appearance. Pleasant &  cooperative throughout exam. Head: Normocephalic without obvious abnormalities Eyes: No corneal or conjunctival inflammation noted.  Extraocular motion intact.Field of  Vision grossly normal. Ears: External  ear exam reveals no significant lesions or deformities. Canals  clear .TMs normal.  Nose: External nasal exam reveals no deformity or inflammation. Nasal mucosa are pink and moist. No lesions or exudates noted.  Mouth: Oral mucosa and oropharynx reveal no lesions or exudates. Dentures. Neck: No deformities, masses, or tenderness noted. Range of motion normal . Thyroid normal. Lungs: Normal respiratory effort; chest expands symmetrically. Lungs are clear to auscultation without rales, wheezes, or increased work of breathing. Heart: Normal rate and rhythm. Normal S1 and S2. No gallop, click, or rub. Grade 1/6 systolic  murmur. Abdomen: Bowel sounds slightly decreased but  abdomen soft and nontender. No masses, organomegaly or hernias noted.                                                                             Musculoskeletal/extremities: No clubbing, cyanosis noted. Tone & strength  normal.Joints normal. 1+ edema LLE; trace RLE. Vascular: dorsalis pedis and  posterior tibial pulses are decreased . Neurologic: Able to name daughters. Deep tendon reflexes symmetrical and normal.          Skin: Intact without suspicious lesions or rashes.Bruising R forearm Lymph: No cervical, axillary lymphadenopathy present. Psych: Mood and affect are normal. Normally interactive ; intermittently laughing                                                                                         Assessment & Plan:  #1 weakness without localizing signs or history  #2 anorexia  Plan: See orders and recommendations.

## 2011-11-09 LAB — URINE CULTURE: Organism ID, Bacteria: NO GROWTH

## 2011-11-14 ENCOUNTER — Encounter: Payer: Self-pay | Admitting: *Deleted

## 2012-02-24 ENCOUNTER — Emergency Department (HOSPITAL_COMMUNITY): Payer: Medicare Other

## 2012-02-24 ENCOUNTER — Encounter: Payer: Self-pay | Admitting: Family Medicine

## 2012-02-24 ENCOUNTER — Encounter (HOSPITAL_COMMUNITY): Payer: Self-pay | Admitting: Emergency Medicine

## 2012-02-24 ENCOUNTER — Inpatient Hospital Stay (HOSPITAL_COMMUNITY)
Admission: EM | Admit: 2012-02-24 | Discharge: 2012-02-29 | DRG: 178 | Disposition: A | Discharge status: Skilled Nursing Facility | Payer: Medicare Other | Attending: Internal Medicine | Admitting: Internal Medicine

## 2012-02-24 ENCOUNTER — Ambulatory Visit (INDEPENDENT_AMBULATORY_CARE_PROVIDER_SITE_OTHER): Payer: Medicare Other | Admitting: Family Medicine

## 2012-02-24 VITALS — BP 108/75 | HR 86 | Temp 98.6°F | Wt 138.0 lb

## 2012-02-24 DIAGNOSIS — R21 Rash and other nonspecific skin eruption: Secondary | ICD-10-CM | POA: Diagnosis present

## 2012-02-24 DIAGNOSIS — J45909 Unspecified asthma, uncomplicated: Secondary | ICD-10-CM

## 2012-02-24 DIAGNOSIS — R42 Dizziness and giddiness: Secondary | ICD-10-CM

## 2012-02-24 DIAGNOSIS — R0902 Hypoxemia: Secondary | ICD-10-CM

## 2012-02-24 DIAGNOSIS — E039 Hypothyroidism, unspecified: Secondary | ICD-10-CM

## 2012-02-24 DIAGNOSIS — H6120 Impacted cerumen, unspecified ear: Secondary | ICD-10-CM

## 2012-02-24 DIAGNOSIS — J4489 Other specified chronic obstructive pulmonary disease: Secondary | ICD-10-CM

## 2012-02-24 DIAGNOSIS — N390 Urinary tract infection, site not specified: Secondary | ICD-10-CM

## 2012-02-24 DIAGNOSIS — R0602 Shortness of breath: Secondary | ICD-10-CM

## 2012-02-24 DIAGNOSIS — E559 Vitamin D deficiency, unspecified: Secondary | ICD-10-CM

## 2012-02-24 DIAGNOSIS — F0390 Unspecified dementia without behavioral disturbance: Secondary | ICD-10-CM | POA: Diagnosis present

## 2012-02-24 DIAGNOSIS — B354 Tinea corporis: Secondary | ICD-10-CM

## 2012-02-24 DIAGNOSIS — R4182 Altered mental status, unspecified: Secondary | ICD-10-CM

## 2012-02-24 DIAGNOSIS — F068 Other specified mental disorders due to known physiological condition: Secondary | ICD-10-CM | POA: Diagnosis present

## 2012-02-24 DIAGNOSIS — J449 Chronic obstructive pulmonary disease, unspecified: Secondary | ICD-10-CM

## 2012-02-24 DIAGNOSIS — M545 Low back pain, unspecified: Secondary | ICD-10-CM

## 2012-02-24 DIAGNOSIS — Z66 Do not resuscitate: Secondary | ICD-10-CM | POA: Diagnosis present

## 2012-02-24 DIAGNOSIS — Z853 Personal history of malignant neoplasm of breast: Secondary | ICD-10-CM

## 2012-02-24 DIAGNOSIS — J189 Pneumonia, unspecified organism: Secondary | ICD-10-CM

## 2012-02-24 DIAGNOSIS — Z79899 Other long term (current) drug therapy: Secondary | ICD-10-CM

## 2012-02-24 DIAGNOSIS — J690 Pneumonitis due to inhalation of food and vomit: Principal | ICD-10-CM | POA: Diagnosis present

## 2012-02-24 DIAGNOSIS — Z87891 Personal history of nicotine dependence: Secondary | ICD-10-CM

## 2012-02-24 DIAGNOSIS — Z7982 Long term (current) use of aspirin: Secondary | ICD-10-CM

## 2012-02-24 DIAGNOSIS — J441 Chronic obstructive pulmonary disease with (acute) exacerbation: Secondary | ICD-10-CM

## 2012-02-24 DIAGNOSIS — R5383 Other fatigue: Secondary | ICD-10-CM

## 2012-02-24 DIAGNOSIS — I10 Essential (primary) hypertension: Secondary | ICD-10-CM

## 2012-02-24 DIAGNOSIS — Z9010 Acquired absence of unspecified breast and nipple: Secondary | ICD-10-CM

## 2012-02-24 DIAGNOSIS — E876 Hypokalemia: Secondary | ICD-10-CM | POA: Diagnosis not present

## 2012-02-24 DIAGNOSIS — R5381 Other malaise: Secondary | ICD-10-CM

## 2012-02-24 DIAGNOSIS — J209 Acute bronchitis, unspecified: Secondary | ICD-10-CM

## 2012-02-24 DIAGNOSIS — E785 Hyperlipidemia, unspecified: Secondary | ICD-10-CM

## 2012-02-24 LAB — DIFFERENTIAL
Eosinophils Absolute: 0 10*3/uL (ref 0.0–0.7)
Eosinophils Relative: 0 % (ref 0–5)
Lymphs Abs: 1.7 10*3/uL (ref 0.7–4.0)
Monocytes Absolute: 2.2 10*3/uL — ABNORMAL HIGH (ref 0.1–1.0)

## 2012-02-24 LAB — URINALYSIS, ROUTINE W REFLEX MICROSCOPIC
Nitrite: NEGATIVE
Protein, ur: 30 mg/dL — AB
Specific Gravity, Urine: 1.027 (ref 1.005–1.030)
Urobilinogen, UA: 1 mg/dL (ref 0.0–1.0)

## 2012-02-24 LAB — URINE MICROSCOPIC-ADD ON

## 2012-02-24 LAB — COMPREHENSIVE METABOLIC PANEL
ALT: 20 U/L (ref 0–35)
BUN: 27 mg/dL — ABNORMAL HIGH (ref 6–23)
Calcium: 9.1 mg/dL (ref 8.4–10.5)
Creatinine, Ser: 0.67 mg/dL (ref 0.50–1.10)
GFR calc Af Amer: >90 mL/min (ref >90)
Glucose, Bld: 103 mg/dL — ABNORMAL HIGH (ref 70–99)
Sodium: 140 mEq/L (ref 135–145)
Total Protein: 7.2 g/dL (ref 6.0–8.3)

## 2012-02-24 LAB — LACTIC ACID, PLASMA: Lactic Acid, Venous: 1.6 mmol/L (ref 0.5–2.2)

## 2012-02-24 LAB — CBC
MCH: 29.5 pg (ref 26.0–34.0)
MCV: 88.4 fL (ref 78.0–100.0)
Platelets: 205 10*3/uL (ref 150–400)
RBC: 4.74 MIL/uL (ref 3.87–5.11)

## 2012-02-24 MED ORDER — SODIUM CHLORIDE 0.9 % IV SOLN
250.0000 mL | INTRAVENOUS | Status: DC | PRN
  Administered 2012-02-26: 250 mL via INTRAVENOUS

## 2012-02-24 MED ORDER — SIMVASTATIN 40 MG PO TABS: 40.0000 mg | ORAL_TABLET | Freq: Every day | ORAL | Status: DC

## 2012-02-24 MED ORDER — ALBUTEROL SULFATE (5 MG/ML) 0.5% IN NEBU: 2.5000 mg | INHALATION_SOLUTION | RESPIRATORY_TRACT | Status: DC | PRN

## 2012-02-24 MED ORDER — DEXTROSE 5 % IV SOLN
1.0000 g | INTRAVENOUS | Status: DC
  Administered 2012-02-24: 1 g via INTRAVENOUS
  Filled 2012-02-24 (×2): qty 10

## 2012-02-24 MED ORDER — AMLODIPINE BESYLATE 5 MG PO TABS
5.0000 mg | ORAL_TABLET | Freq: Every day | ORAL | Status: DC
  Administered 2012-02-25 – 2012-02-29 (×5): 5 mg via ORAL
  Filled 2012-02-24 (×5): qty 1

## 2012-02-24 MED ORDER — ASPIRIN 81 MG PO CHEW
81.0000 mg | CHEWABLE_TABLET | Freq: Every day | ORAL | Status: DC
  Administered 2012-02-25 – 2012-02-29 (×5): 81 mg via ORAL
  Filled 2012-02-24 (×5): qty 1

## 2012-02-24 MED ORDER — DEXTROSE 5 % IV SOLN
1.0000 g | INTRAVENOUS | Status: DC
  Administered 2012-02-25 – 2012-02-28 (×4): 1 g via INTRAVENOUS
  Filled 2012-02-24 (×5): qty 10

## 2012-02-24 MED ORDER — ALBUTEROL SULFATE (5 MG/ML) 0.5% IN NEBU
2.5000 mg | INHALATION_SOLUTION | Freq: Four times a day (QID) | RESPIRATORY_TRACT | Status: DC
  Administered 2012-02-25 – 2012-02-29 (×18): 2.5 mg via RESPIRATORY_TRACT
  Filled 2012-02-24 (×18): qty 0.5

## 2012-02-24 MED ORDER — SODIUM CHLORIDE 0.9 % IJ SOLN
3.0000 mL | Freq: Two times a day (BID) | INTRAMUSCULAR | Status: DC
  Administered 2012-02-24 – 2012-02-29 (×4): 3 mL via INTRAVENOUS

## 2012-02-24 MED ORDER — MEMANTINE HCL 10 MG PO TABS
10.0000 mg | ORAL_TABLET | Freq: Two times a day (BID) | ORAL | Status: DC
  Administered 2012-02-25 – 2012-02-29 (×9): 10 mg via ORAL
  Filled 2012-02-24 (×10): qty 1

## 2012-02-24 MED ORDER — ALBUTEROL SULFATE (5 MG/ML) 0.5% IN NEBU
2.5000 mg | INHALATION_SOLUTION | Freq: Once | RESPIRATORY_TRACT | Status: AC
  Administered 2012-02-24: 2.5 mg via RESPIRATORY_TRACT

## 2012-02-24 MED ORDER — DEXTROSE 5 % IV SOLN
500.0000 mg | INTRAVENOUS | Status: DC
  Administered 2012-02-25: 500 mg via INTRAVENOUS
  Filled 2012-02-24 (×2): qty 500

## 2012-02-24 MED ORDER — LEVOTHYROXINE SODIUM 88 MCG PO TABS
88.0000 ug | ORAL_TABLET | Freq: Every day | ORAL | Status: DC
  Administered 2012-02-25 – 2012-02-29 (×4): 88 ug via ORAL
  Filled 2012-02-24 (×7): qty 1

## 2012-02-24 MED ORDER — ONDANSETRON HCL 4 MG PO TABS: 4.0000 mg | ORAL_TABLET | Freq: Four times a day (QID) | ORAL | Status: DC | PRN

## 2012-02-24 MED ORDER — DONEPEZIL HCL 10 MG PO TABS
10.0000 mg | ORAL_TABLET | Freq: Every evening | ORAL | Status: DC
  Administered 2012-02-25 – 2012-02-28 (×3): 10 mg via ORAL
  Filled 2012-02-24 (×5): qty 1

## 2012-02-24 MED ORDER — ATORVASTATIN CALCIUM 20 MG PO TABS
20.0000 mg | ORAL_TABLET | Freq: Every day | ORAL | Status: DC
  Administered 2012-02-25 – 2012-02-28 (×3): 20 mg via ORAL
  Filled 2012-02-24 (×5): qty 1

## 2012-02-24 MED ORDER — MELOXICAM 7.5 MG PO TABS
7.5000 mg | ORAL_TABLET | Freq: Every day | ORAL | Status: DC
  Administered 2012-02-25 – 2012-02-29 (×5): 7.5 mg via ORAL
  Filled 2012-02-24 (×5): qty 1

## 2012-02-24 MED ORDER — BUDESONIDE-FORMOTEROL FUMARATE 160-4.5 MCG/ACT IN AERO
1.0000 | INHALATION_SPRAY | Freq: Two times a day (BID) | RESPIRATORY_TRACT | Status: DC
  Administered 2012-02-25 – 2012-02-29 (×9): 1 via RESPIRATORY_TRACT
  Filled 2012-02-24: qty 6

## 2012-02-24 MED ORDER — IPRATROPIUM BROMIDE 0.02 % IN SOLN
0.5000 mg | Freq: Once | RESPIRATORY_TRACT | Status: AC
  Administered 2012-02-24: 0.5 mg via RESPIRATORY_TRACT

## 2012-02-24 MED ORDER — ENOXAPARIN SODIUM 40 MG/0.4ML ~~LOC~~ SOLN
40.0000 mg | SUBCUTANEOUS | Status: DC
  Administered 2012-02-25 – 2012-02-26 (×2): 40 mg via SUBCUTANEOUS
  Filled 2012-02-24 (×2): qty 0.4

## 2012-02-24 MED ORDER — SODIUM CHLORIDE 0.9 % IV SOLN
Freq: Once | INTRAVENOUS | Status: AC
  Administered 2012-02-24: 18:00:00 via INTRAVENOUS

## 2012-02-24 MED ORDER — DEXTROSE 5 % IV SOLN
500.0000 mg | INTRAVENOUS | Status: DC
  Administered 2012-02-25 – 2012-02-27 (×3): 500 mg via INTRAVENOUS
  Filled 2012-02-24 (×4): qty 500

## 2012-02-24 MED ORDER — ALPRAZOLAM 0.25 MG PO TABS
0.2500 mg | ORAL_TABLET | Freq: Three times a day (TID) | ORAL | Status: DC | PRN
  Administered 2012-02-26 – 2012-02-27 (×2): 0.25 mg via ORAL
  Filled 2012-02-24 (×2): qty 1

## 2012-02-24 MED ORDER — TRAMADOL HCL 50 MG PO TABS: 50.0000 mg | ORAL_TABLET | Freq: Four times a day (QID) | ORAL | Status: DC | PRN

## 2012-02-24 MED ORDER — ONDANSETRON HCL 4 MG/2ML IJ SOLN: 4.0000 mg | Freq: Four times a day (QID) | INTRAMUSCULAR | Status: DC | PRN

## 2012-02-24 MED ORDER — SODIUM CHLORIDE 0.9 % IJ SOLN: 3.0000 mL | INTRAMUSCULAR | Status: DC | PRN

## 2012-02-24 NOTE — H&P (Signed)
PCP:   Loreen Freud, DO, DO   Chief Complaint: Cough, progressive wheezing and incontinence   HPI: [name] is an 78 y.o. female with history of advanced dementia, resident of memory unit, history of asthma, hypothyroidism and hypertension, brought in by family because she has been having increase shortness of breath frequent cough and audible wheezes. Family also states that she has incontinence and this is new for her.  She has advanced dementia and does not give any meaningful history. Evaluation in the emergency room included a urinalysis which showed moderate leukocytews, 21-50 WBC, and bacteria. Her chest x-ray showed right-sided pulmonary infiltrate consistent with pneumonia or aspiration. She has a leukocytosis with a white count of 14.4 thousand, normal lactate level, and unremarkable renal function tests. Hospitalist was asked to admit her for pneumonia and UTI.  Rewiew of Systems: She did admit to having frequent cough and shortness of breath but otherwise her review of systems not remarkable.    Past Medical History  Diagnosis Date  . Asthma   . Hyperlipidemia   . Hypertension   . Hypothyroidism   . Dementia     Past Surgical History  Procedure Date  . Carotid endarterectomy   . Cataract extraction   . Abdominal hysterectomy   . Breast lumpectomy   . Mastectomy     Left, 07/15/2002  . Back surgery     Medications:  HOME MEDS: Prior to Admission medications   Medication Sig Start Date End Date Taking? Authorizing Provider  albuterol (PROVENTIL HFA;VENTOLIN HFA) 108 (90 BASE) MCG/ACT inhaler Inhale 2 puffs into the lungs every 6 (six) hours as needed. For wheezing   Yes Historical Provider, MD  ALPRAZolam (XANAX) 0.5 MG tablet Take 0.25 mg by mouth every 8 (eight) hours as needed. For anxiety   Yes Historical Provider, MD  amLODipine (NORVASC) 5 MG tablet Take 5 mg by mouth daily.     Yes Historical Provider, MD  aspirin 81 MG chewable tablet Chew 81 mg by  mouth daily.   Yes Historical Provider, MD  budesonide-formoterol (SYMBICORT) 160-4.5 MCG/ACT inhaler Inhale 1 puff into the lungs 2 (two) times daily. Gargle & spit after use   Yes Historical Provider, MD  donepezil (ARICEPT) 10 MG tablet Take 10 mg by mouth every evening.    Yes Historical Provider, MD  guaiFENesin (MUCINEX) 600 MG 12 hr tablet Take 600 mg by mouth every 12 (twelve) hours as needed. For chest congestion   Yes Historical Provider, MD  guaiFENesin (ROBITUSSIN) 100 MG/5ML SOLN Take 10 mLs by mouth every 6 (six) hours as needed. For cough   Yes Historical Provider, MD  ipratropium-albuterol (DUONEB) 0.5-2.5 (3) MG/3ML SOLN Take 3 mLs by nebulization every 6 (six) hours as needed. For wheezing   Yes Historical Provider, MD  levothyroxine (SYNTHROID, LEVOTHROID) 88 MCG tablet Take 88 mcg by mouth daily.     Yes Historical Provider, MD  meclizine (ANTIVERT) 25 MG tablet Take 25 mg by mouth every 6 (six) hours as needed. For vertigo   Yes Historical Provider, MD  meloxicam (MOBIC) 7.5 MG tablet Take 7.5 mg by mouth daily. Take with food   Yes Historical Provider, MD  memantine (NAMENDA) 10 MG tablet Take 10 mg by mouth 2 (two) times daily.     Yes Historical Provider, MD  pravastatin (PRAVACHOL) 80 MG tablet Take 80 mg by mouth at bedtime.    Yes Historical Provider, MD  traMADol (ULTRAM) 50 MG tablet Take 50 mg by mouth  every 6 (six) hours as needed. Maximum dose= 8 tablets per day. For pain   Yes Historical Provider, MD     Allergies:  No Known Allergies  Social History:   reports that she has quit smoking. She does not have any smokeless tobacco history on file. She reports that she does not drink alcohol or use illicit drugs.  Family History: No family history on file.   Physical Exam: Filed Vitals:   02/24/12 1657 02/24/12 1700 02/24/12 1854  BP: 118/50  124/65  Pulse: 98    Temp: 98.2 F (36.8 C)  98.3 F (36.8 C)  TempSrc: Oral  Oral  Resp: 20    SpO2: 91% 96% 94%    Blood pressure 124/65, pulse 98, temperature 98.3 F (36.8 C), temperature source Oral, resp. rate 20, SpO2 94.00%.  GEN:  Pleasant person lying in the stretcher in no acute distress; cooperative with exam PSYCH:  alert and oriented x4; does not appear anxious or depressed; affect is appropriate. HEENT: Mucous membranes pink and anicteric; PERRLA; EOM intact; no cervical lymphadenopathy nor thyromegaly or carotid bruit; no JVD; Breasts:: Not examined CHEST WALL: No tenderness CHEST: Normal respiration, she has scattered rhonchi and audible wheezing HEART: Regular rate and rhythm; no murmurs rubs or gallops BACK: No kyphosis or scoliosis; no CVA tenderness ABDOMEN: Obese, soft non-tender; no masses, no organomegaly, normal abdominal bowel sounds; no pannus; no intertriginous candida. Rectal Exam: Not done EXTREMITIES: No bone or joint deformity; age-appropriate arthropathy of the hands and knees; no edema; no ulcerations. Genitalia: not examined PULSES: 2+ and symmetric SKIN: Normal hydration no rash or ulceration CNS: Cranial nerves 2-12 grossly intact no focal lateralizing neurologic deficit   Labs & Imaging Results for orders placed during the hospital encounter of 02/24/12 (from the past 48 hour(s))  CBC     Status: Abnormal   Collection Time   02/24/12  5:36 PM      Component Value Range Comment   WBC 14.4 (*) 4.0 - 10.5 (K/uL)    RBC 4.74  3.87 - 5.11 (MIL/uL)    Hemoglobin 14.0  12.0 - 15.0 (g/dL)    HCT 40.9  81.1 - 91.4 (%)    MCV 88.4  78.0 - 100.0 (fL)    MCH 29.5  26.0 - 34.0 (pg)    MCHC 33.4  30.0 - 36.0 (g/dL)    RDW 78.2  95.6 - 21.3 (%)    Platelets 205  150 - 400 (K/uL)   DIFFERENTIAL     Status: Abnormal   Collection Time   02/24/12  5:36 PM      Component Value Range Comment   Neutrophils Relative 73  43 - 77 (%)    Neutro Abs 10.5 (*) 1.7 - 7.7 (K/uL)    Lymphocytes Relative 12  12 - 46 (%)    Lymphs Abs 1.7  0.7 - 4.0 (K/uL)    Monocytes Relative 15  (*) 3 - 12 (%)    Monocytes Absolute 2.2 (*) 0.1 - 1.0 (K/uL)    Eosinophils Relative 0  0 - 5 (%)    Eosinophils Absolute 0.0  0.0 - 0.7 (K/uL)    Basophils Relative 0  0 - 1 (%)    Basophils Absolute 0.0  0.0 - 0.1 (K/uL)   COMPREHENSIVE METABOLIC PANEL     Status: Abnormal   Collection Time   02/24/12  5:36 PM      Component Value Range Comment   Sodium 140  135 -  145 (mEq/L)    Potassium 3.6  3.5 - 5.1 (mEq/L)    Chloride 100  96 - 112 (mEq/L)    CO2 29  19 - 32 (mEq/L)    Glucose, Bld 103 (*) 70 - 99 (mg/dL)    BUN 27 (*) 6 - 23 (mg/dL)    Creatinine, Ser 1.61  0.50 - 1.10 (mg/dL)    Calcium 9.1  8.4 - 10.5 (mg/dL)    Total Protein 7.2  6.0 - 8.3 (g/dL)    Albumin 3.3 (*) 3.5 - 5.2 (g/dL)    AST 26  0 - 37 (U/L) SLIGHT HEMOLYSIS   ALT 20  0 - 35 (U/L)    Alkaline Phosphatase 62  39 - 117 (U/L)    Total Bilirubin 0.5  0.3 - 1.2 (mg/dL)    GFR calc non Af Amer 80 (*) >90 (mL/min)    GFR calc Af Amer >90  >90 (mL/min)   LACTIC ACID, PLASMA     Status: Normal   Collection Time   02/24/12  5:51 PM      Component Value Range Comment   Lactic Acid, Venous 1.6  0.5 - 2.2 (mmol/L)   URINALYSIS, ROUTINE W REFLEX MICROSCOPIC     Status: Abnormal   Collection Time   02/24/12  6:04 PM      Component Value Range Comment   Color, Urine AMBER (*) YELLOW  BIOCHEMICALS MAY BE AFFECTED BY COLOR   APPearance HAZY (*) CLEAR     Specific Gravity, Urine 1.027  1.005 - 1.030     pH 6.0  5.0 - 8.0     Glucose, UA NEGATIVE  NEGATIVE (mg/dL)    Hgb urine dipstick NEGATIVE  NEGATIVE     Bilirubin Urine SMALL (*) NEGATIVE     Ketones, ur 15 (*) NEGATIVE (mg/dL)    Protein, ur 30 (*) NEGATIVE (mg/dL)    Urobilinogen, UA 1.0  0.0 - 1.0 (mg/dL)    Nitrite NEGATIVE  NEGATIVE     Leukocytes, UA MODERATE (*) NEGATIVE    URINE MICROSCOPIC-ADD ON     Status: Abnormal   Collection Time   02/24/12  6:04 PM      Component Value Range Comment   Squamous Epithelial / LPF FEW (*) RARE     WBC, UA 21-50  <3  (WBC/hpf)    Bacteria, UA FEW (*) RARE     Casts HYALINE CASTS (*) NEGATIVE     Urine-Other MUCOUS PRESENT      Dg Chest 2 View  02/24/2012  *RADIOLOGY REPORT*  Clinical Data: Cough.  Pneumonia.  Demented.  Question urinary tract infection.  Nonsmoker with COPD.  CHEST - 2 VIEW  Comparison: 12/06/2010  Findings: Lateral view degraded by patient positioning. Hyperinflation.  Lower thoracic compression deformities, suboptimally evaluated.  Surgical clips in the left axilla.  Probable left mastectomy. Midline trachea.  Mild cardiomegaly with atherosclerosis in the transverse aorta.  Probable small right pleural effusion, new. No pneumothorax.  Scarring at the left lung base.  Patchy airspace disease within the right lower and likely right upper lobes. Suggestion of reticular nodular component in the right upper lobe.  IMPRESSION: Right-sided airspace disease, suspicious for infection or aspiration.  New since 12/06/2010. Recommend radiographic follow-up until clearing.  Probable small right pleural effusion.  Original Report Authenticated By: Consuello Bossier, M.D.      Assessment Present on Admission:  .PNA (pneumonia) .HYPOTHYROIDISM .COPD (chronic obstructive pulmonary disease) .HYPERTENSION .DEMENTIA .UTI (urinary tract infection)  PLAN:  Will admit her to hospital treat for community-acquired pneumonia with IV Rocephin and Zithromax. The IV Rocephin she take care of her urinary tract infection as well. For her hypothyroidism we'll continue her Synthroid and check TSH. She does have history of COPD, and his wheezing, and thus we'll give her nebulizer treatment. Will try to avoid steroid. Her dementia is rather advanced and I am not sure if Aricept and Namenda would be helpful anymore, but will continue and I will defer to her PCP. For her hypertension it has been in good control. I discuss her CODE STATUS with her 2 daughters who are at her bedside and reconfirmed the night that she is a DO NOT  RESUSCITATE. Other plans as per orders.    [name] 02/24/2012, 10:46 PM

## 2012-02-24 NOTE — ED Notes (Signed)
To ED from PCP (lives in nursing home), family reports increased mental status changes in the last week, has non-productive cough, 93% on room air, ronchi in all fields per EMS, 1 neb pta, no pain or other complaints, NAD

## 2012-02-24 NOTE — Progress Notes (Signed)
  Subjective:    Patient ID: [name], female    DOB: 1927/11/26, 78 y.o.   MRN: [id number]  HPI URI- son reports [name]'s in a memory care home and facility reports '[name]'s really out of it', has a 'rattle in her chest', and has been incontinent multiple times this week which is out of character.  Pt almost passed out attempting to walk into the building.  Hx of COPD, PNA.  Not O2 requiring.  Pt unable to answer questions or participate in her care.  Both daughter and son are w/ pt today.  No documented POA in chart, no advanced directive.   Review of Systems For ROS see HPI     Objective:   Physical Exam  Vitals reviewed. Constitutional: [name] appears well-developed and well-nourished.       Frail, elderly  HENT:  Head: Normocephalic and atraumatic.  Neck: Neck supple.  Cardiovascular: Normal rate and regular rhythm.   Pulmonary/Chest: [name] is in respiratory distress. [name] has no wheezes.       Rhonchi throughout, R>L O2 sats labile- best seen 89% on RA, lowest recorded 82% on RA O2 via nasal canula applied  Abdominal: Soft. Bowel sounds are normal. [name] exhibits no distension. There is no tenderness. There is no rebound and no guarding.  Musculoskeletal: [name] exhibits edema (bilateral +1 LE edema).  Lymphadenopathy:    [name] has no cervical adenopathy.  Neurological:       Unable to answer questions, sitting w/ eyes closed for most of the visit, will occasionally nod but not in response to a question          Assessment & Plan:

## 2012-02-24 NOTE — ED Provider Notes (Signed)
History     CSN: [id number]  Arrival date & time 02/24/12  1651   First MD Initiated Contact with Patient 02/24/12 1709      Chief Complaint  Patient presents with  . Cough    (Consider location/radiation/quality/duration/timing/severity/associated sxs/prior treatment) Patient is a 78 y.o. female presenting with cough. The history is provided by the patient and a relative.  Cough   patient here with family complaining of shortness of breath and cough. Seen by her primary care Dr. today in the office and clinically diagnosed pneumonia and sent here for further evaluation. Pulse oximetry the office was 84% room-air. Patient does have a history of COPD she is not on home oxygen. No vomiting, diarrhea. No syncope. Patient's appetite has been good. Patient given albuterol and oxygen prior to arrival by EMS and she does feel better at this time  Past Medical History  Diagnosis Date  . Asthma   . Hyperlipidemia   . Hypertension   . Hypothyroidism   . Dementia     Past Surgical History  Procedure Date  . Carotid endarterectomy   . Cataract extraction   . Abdominal hysterectomy   . Breast lumpectomy   . Mastectomy     Left, 07/15/2002  . Back surgery     No family history on file.  History  Substance Use Topics  . Smoking status: Former Games developer  . Smokeless tobacco: Not on file   Comment: 10 YEARS AGO AS OF 2012  . Alcohol Use: No    OB History    Grav Para Term Preterm Abortions TAB SAB Ect Mult Living                  Review of Systems  Respiratory: Positive for cough.   All other systems reviewed and are negative.    Allergies  Review of patient's allergies indicates no active allergies.  Home Medications   Current Outpatient Rx  Name Route Sig Dispense Refill  . ALBUTEROL SULFATE HFA 108 (90 BASE) MCG/ACT IN AERS Inhalation Inhale 2 puffs into the lungs every 6 (six) hours as needed.      . ALPRAZOLAM 0.5 MG PO TABS Oral Take 0.5 mg by mouth at bedtime  as needed.      Marland Kitchen AMLODIPINE BESYLATE 5 MG PO TABS Oral Take 5 mg by mouth daily.      . ASPIRIN 81 MG PO TABS Oral Take 81 mg by mouth daily.      . BUDESONIDE-FORMOTEROL FUMARATE 160-4.5 MCG/ACT IN AERO Inhalation Inhale 1 puff into the lungs 2 (two) times daily.      . DONEPEZIL HCL 10 MG PO TABS Oral Take 10 mg by mouth daily.      . GUAIFENESIN ER 600 MG PO TB12 Oral Take 1,200 mg by mouth 2 (two) times daily.      . GUAIFENESIN 100 MG/5ML PO SOLN Oral Take 10 mLs by mouth every 6 (six) hours as needed.      . IPRATROPIUM-ALBUTEROL 0.5-2.5 (3) MG/3ML IN SOLN Nebulization Take 3 mLs by nebulization every 6 (six) hours as needed.      Marland Kitchen LEVOTHYROXINE SODIUM 88 MCG PO TABS Oral Take 88 mcg by mouth daily.      Marland Kitchen MECLIZINE HCL 25 MG PO TABS Oral Take 25 mg by mouth every 6 (six) hours as needed.      . MELOXICAM 7.5 MG PO TABS Oral Take 7.5 mg by mouth daily.      Marland Kitchen MEMANTINE  HCL 10 MG PO TABS Oral Take 10 mg by mouth 2 (two) times daily.      Marland Kitchen PRAVASTATIN SODIUM 80 MG PO TABS Oral Take 80 mg by mouth daily.      . TRAMADOL HCL 50 MG PO TABS Oral Take 50 mg by mouth every 6 (six) hours as needed. Maximum dose= 8 tablets per day       BP 118/50  Pulse 98  Temp(Src) 98.2 F (36.8 C) (Oral)  Resp 20  SpO2 96%  Physical Exam  Nursing note and vitals reviewed. Constitutional: She is oriented to person, place, and time. She appears well-developed and well-nourished.  Non-toxic appearance. No distress.  HENT:  Head: Normocephalic and atraumatic.  Eyes: Conjunctivae, EOM and lids are normal. Pupils are equal, round, and reactive to light.  Neck: Normal range of motion. Neck supple. No tracheal deviation present. No mass present.  Cardiovascular: Normal rate, regular rhythm and normal heart sounds.  Exam reveals no gallop.   No murmur heard. Pulmonary/Chest: Effort normal. No stridor. No respiratory distress. She has decreased breath sounds. She has no wheezes. She has rhonchi. She has no  rales.  Abdominal: Soft. Normal appearance and bowel sounds are normal. She exhibits no distension. There is no tenderness. There is no rebound and no CVA tenderness.  Musculoskeletal: Normal range of motion. She exhibits no edema and no tenderness.  Neurological: She is alert and oriented to person, place, and time. She has normal strength. No cranial nerve deficit or sensory deficit. GCS eye subscore is 4. GCS verbal subscore is 5. GCS motor subscore is 6.  Skin: Skin is warm and dry. No abrasion and no rash noted.  Psychiatric: She has a normal mood and affect. Her speech is normal and behavior is normal.    ED Course  Procedures (including critical care time)  Labs Reviewed - No data to display No results found.   No diagnosis found.    MDM  Patient started on antibiotics for her UTI and pneumonia. She will be admitted by [facility] hospitalist        Toy Baker, MD 02/24/12 2145

## 2012-02-24 NOTE — ED Notes (Signed)
Pt had a left side mastectomy. Placed restricted band on left arm

## 2012-02-25 LAB — CBC
MCH: 29.3 pg (ref 26.0–34.0)
MCH: 29.5 pg (ref 26.0–34.0)
MCHC: 32.8 g/dL (ref 30.0–36.0)
MCHC: 33.2 g/dL (ref 30.0–36.0)
Platelets: 167 10*3/uL (ref 150–400)
Platelets: 233 10*3/uL (ref 150–400)
RBC: 4.58 MIL/uL (ref 3.87–5.11)
RDW: 13.9 % (ref 11.5–15.5)

## 2012-02-25 LAB — BASIC METABOLIC PANEL
BUN: 22 mg/dL (ref 6–23)
Calcium: 8.8 mg/dL (ref 8.4–10.5)
GFR calc non Af Amer: 86 mL/min — ABNORMAL LOW (ref >90)
Glucose, Bld: 93 mg/dL (ref 70–99)

## 2012-02-25 LAB — URINE CULTURE

## 2012-02-25 LAB — MRSA PCR SCREENING: MRSA by PCR: NEGATIVE

## 2012-02-25 LAB — CREATININE, SERUM: Creatinine, Ser: 0.58 mg/dL (ref 0.50–1.10)

## 2012-02-25 MED ORDER — POTASSIUM CHLORIDE CRYS ER 20 MEQ PO TBCR
40.0000 meq | EXTENDED_RELEASE_TABLET | Freq: Two times a day (BID) | ORAL | Status: AC
  Administered 2012-02-25 (×2): 40 meq via ORAL
  Filled 2012-02-25 (×2): qty 2

## 2012-02-25 NOTE — Progress Notes (Signed)
DAILY PROGRESS NOTE                              [facility]  SUBJECTIVE: Patient has no complaints this morning, patient is pleasantly demented.  OBJECTIVE: BP 129/74  Pulse 94  Temp(Src) 97.6 F (36.4 C) (Oral)  Resp 19  Ht 5\' 4"  (1.626 m)  Wt 64 kg (141 lb 1.5 oz)  BMI 24.22 kg/m2  SpO2 94%  Intake/Output Summary (Last 24 hours) at 02/25/12 1057 Last data filed at 02/25/12 0215  Gross per 24 hour  Intake      3 ml  Output    200 ml  Net   -197 ml                      Weight change:  Physical Exam: General: Alert, disoriented not in any acute distress. HEENT: anicteric sclera, pupils equal reactive to light and accommodation CVS: S1-S2 heard, no murmur rubs or gallops Chest: clear to auscultation bilaterally, no wheezing rales or rhonchi Abdomen:  normal bowel sounds, soft, nontender, nondistended, no organomegaly Neuro: Cranial nerves II-XII intact, no focal neurological deficits Extremities: no cyanosis, no clubbing or edema noted bilaterally   Lab Results:  Basename 02/25/12 0700 02/24/12 2359 02/24/12 1736  NA 139 -- 140  K 2.9* -- 3.6  CL 100 -- 100  CO2 31 -- 29  GLUCOSE 93 -- 103*  BUN 22 -- 27*  CREATININE 0.54 0.58 --  CALCIUM 8.8 -- 9.1  MG -- -- --  PHOS -- -- --    Basename 02/24/12 1736  AST 26  ALT 20  ALKPHOS 62  BILITOT 0.5  PROT 7.2  ALBUMIN 3.3*   No results found for this basename: LIPASE:2,AMYLASE:2 in the last 72 hours  Basename 02/25/12 0700 02/24/12 2359 02/24/12 1736  WBC 11.8* 12.4* --  NEUTROABS -- -- 10.5*  HGB 13.1 13.5 --  HCT 40.0 40.7 --  MCV 89.5 88.9 --  PLT 233 167 --   Micro Results: Recent Results (from the past 240 hour(s))  MRSA PCR SCREENING     Status: Normal   Collection Time   02/24/12 11:55 PM      Component Value Range Status Comment   MRSA by PCR NEGATIVE  NEGATIVE  Final     Studies/Results: Dg Chest 2 View  02/24/2012  *RADIOLOGY REPORT*  Clinical Data: Cough.   Pneumonia.  Demented.  Question urinary tract infection.  Nonsmoker with COPD.  CHEST - 2 VIEW  Comparison: 12/06/2010  Findings: Lateral view degraded by patient positioning. Hyperinflation.  Lower thoracic compression deformities, suboptimally evaluated.  Surgical clips in the left axilla.  Probable left mastectomy. Midline trachea.  Mild cardiomegaly with atherosclerosis in the transverse aorta.  Probable small right pleural effusion, new. No pneumothorax.  Scarring at the left lung base.  Patchy airspace disease within the right lower and likely right upper lobes. Suggestion of reticular nodular component in the right upper lobe.  IMPRESSION: Right-sided airspace disease, suspicious for infection or aspiration.  New since 12/06/2010. Recommend radiographic follow-up until clearing.  Probable small right pleural effusion.  Original Report Authenticated By: Consuello Bossier, M.D.   Medications: Scheduled Meds:   . sodium chloride   Intravenous Once  . albuterol  2.5 mg Nebulization Q6H  . amLODipine  5 mg Oral Daily  . aspirin  81 mg Oral Daily  . atorvastatin  20 mg Oral q1800  . azithromycin  500 mg Intravenous Q24H  . azithromycin  500 mg Intravenous Q24H  . budesonide-formoterol  1 puff Inhalation BID  . cefTRIAXone (ROCEPHIN)  IV  1 g Intravenous Q24H  . cefTRIAXone (ROCEPHIN)  IV  1 g Intravenous Q24H  . donepezil  10 mg Oral QPM  . enoxaparin  40 mg Subcutaneous Q24H  . levothyroxine  88 mcg Oral QAC breakfast  . meloxicam  7.5 mg Oral Daily  . memantine  10 mg Oral BID  . sodium chloride  3 mL Intravenous Q12H  . DISCONTD: simvastatin  40 mg Oral q1800   Continuous Infusions:  PRN Meds:.sodium chloride, albuterol, ALPRAZolam, ondansetron (ZOFRAN) IV, ondansetron, sodium chloride, traMADol  ASSESSMENT & PLAN: Principal Problem:  *PNA (pneumonia) Active Problems:  HYPOTHYROIDISM  DEMENTIA  HYPERTENSION  BREAST CANCER, HX OF  COPD (chronic obstructive pulmonary disease)  UTI  (urinary tract infection)   Pneumonia -Right lower lobe pneumonia, cannot rule out aspiration because of the age and the location of the pneumonia. -I will ask SLP to evaluate her swallowing. -Meanwhile I will treat as community-acquired pneumonia with Rocephin and azithromycin. -Continue bronchodilators, mucolytics, supplemental oxygen and antipyretics as needed  UTI -Urinalysis sent, waiting for her culture and identification. -Patient is on Rocephin.  HTN -Continue preadmission medications. -Blood pressure so far seems very reasonable.  Advanced dementia -Patient is on Namenda and Aricept, I will continue. -High risk for sundowning, confusion and acute delirium in the hospital.   LOS: 1 day   Rozina Pointer A 02/25/2012, 10:57 AM

## 2012-02-26 DIAGNOSIS — R0602 Shortness of breath: Secondary | ICD-10-CM | POA: Insufficient documentation

## 2012-02-26 DIAGNOSIS — R0902 Hypoxemia: Secondary | ICD-10-CM | POA: Insufficient documentation

## 2012-02-26 DIAGNOSIS — R4182 Altered mental status, unspecified: Secondary | ICD-10-CM | POA: Insufficient documentation

## 2012-02-26 LAB — BASIC METABOLIC PANEL
BUN: 17 mg/dL (ref 6–23)
Chloride: 103 mEq/L (ref 96–112)
Creatinine, Ser: 0.55 mg/dL (ref 0.50–1.10)
GFR calc Af Amer: >90 mL/min (ref >90)
GFR calc non Af Amer: 85 mL/min — ABNORMAL LOW (ref >90)
Glucose, Bld: 102 mg/dL — ABNORMAL HIGH (ref 70–99)

## 2012-02-26 LAB — CBC
HCT: 42.4 % (ref 36.0–46.0)
MCH: 29.1 pg (ref 26.0–34.0)
MCHC: 32.1 g/dL (ref 30.0–36.0)
RDW: 13.7 % (ref 11.5–15.5)

## 2012-02-26 MED ORDER — POTASSIUM CHLORIDE CRYS ER 20 MEQ PO TBCR
40.0000 meq | EXTENDED_RELEASE_TABLET | Freq: Once | ORAL | Status: AC
  Administered 2012-02-26: 40 meq via ORAL
  Filled 2012-02-26: qty 2

## 2012-02-26 NOTE — Progress Notes (Signed)
DAILY PROGRESS NOTE                              [facility]  SUBJECTIVE: Patient is pleasantly demented. That is weak cough.  OBJECTIVE: BP 125/75  Pulse 81  Temp(Src) 98.1 F (36.7 C) (Axillary)  Resp 20  Ht 5\' 4"  (1.626 m)  Wt 64 kg (141 lb 1.5 oz)  BMI 24.22 kg/m2  SpO2 96%  Intake/Output Summary (Last 24 hours) at 02/26/12 1004 Last data filed at 02/26/12 0900  Gross per 24 hour  Intake    240 ml  Output      0 ml  Net    240 ml                      Weight change:  Physical Exam: General: Alert, disoriented not in any acute distress. HEENT: anicteric sclera, pupils equal reactive to light and accommodation CVS: S1-S2 heard, no murmur rubs or gallops Chest: Has rhonchi bilaterally  Abdomen:  normal bowel sounds, soft, nontender, nondistended, no organomegaly Neuro: Awake, follows command rest of exam not done because patient is bedbound Extremities: no cyanosis, no clubbing or edema noted bilaterally   Lab Results:  Basename 02/25/12 0700 02/24/12 2359 02/24/12 1736  NA 139 -- 140  K 2.9* -- 3.6  CL 100 -- 100  CO2 31 -- 29  GLUCOSE 93 -- 103*  BUN 22 -- 27*  CREATININE 0.54 0.58 --  CALCIUM 8.8 -- 9.1  MG -- -- --  PHOS -- -- --    Basename 02/24/12 1736  AST 26  ALT 20  ALKPHOS 62  BILITOT 0.5  PROT 7.2  ALBUMIN 3.3*   No results found for this basename: LIPASE:2,AMYLASE:2 in the last 72 hours  Basename 02/25/12 0700 02/24/12 2359 02/24/12 1736  WBC 11.8* 12.4* --  NEUTROABS -- -- 10.5*  HGB 13.1 13.5 --  HCT 40.0 40.7 --  MCV 89.5 88.9 --  PLT 233 167 --   Micro Results: Recent Results (from the past 240 hour(s))  URINE CULTURE     Status: Normal   Collection Time   02/24/12  6:04 PM      Component Value Range Status Comment   Specimen Description URINE, CLEAN CATCH   Final    Special Requests NONE   Final    Culture  Setup Time [id number]   Final    Colony Count 50,000 COLONIES/ML   Final    Culture      Final    Value: Multiple bacterial morphotypes present, none predominant. Suggest appropriate recollection if clinically indicated.   Report Status 02/25/2012 FINAL   Final   MRSA PCR SCREENING     Status: Normal   Collection Time   02/24/12 11:55 PM      Component Value Range Status Comment   MRSA by PCR NEGATIVE  NEGATIVE  Final     Studies/Results: Dg Chest 2 View  02/24/2012  *RADIOLOGY REPORT*  Clinical Data: Cough.  Pneumonia.  Demented.  Question urinary tract infection.  Nonsmoker with COPD.  CHEST - 2 VIEW  Comparison: 12/06/2010  Findings: Lateral view degraded by patient positioning. Hyperinflation.  Lower thoracic compression deformities, suboptimally evaluated.  Surgical clips in the left axilla.  Probable left mastectomy. Midline trachea.  Mild cardiomegaly with atherosclerosis in the transverse aorta.  Probable small right pleural effusion, new. No pneumothorax.  Scarring at the  left lung base.  Patchy airspace disease within the right lower and likely right upper lobes. Suggestion of reticular nodular component in the right upper lobe.  IMPRESSION: Right-sided airspace disease, suspicious for infection or aspiration.  New since 12/06/2010. Recommend radiographic follow-up until clearing.  Probable small right pleural effusion.  Original Report Authenticated By: Consuello Bossier, M.D.   Medications: Scheduled Meds:    . albuterol  2.5 mg Nebulization Q6H  . amLODipine  5 mg Oral Daily  . aspirin  81 mg Oral Daily  . atorvastatin  20 mg Oral q1800  . azithromycin  500 mg Intravenous Q24H  . budesonide-formoterol  1 puff Inhalation BID  . cefTRIAXone (ROCEPHIN)  IV  1 g Intravenous Q24H  . donepezil  10 mg Oral QPM  . enoxaparin  40 mg Subcutaneous Q24H  . levothyroxine  88 mcg Oral QAC breakfast  . meloxicam  7.5 mg Oral Daily  . memantine  10 mg Oral BID  . potassium chloride  40 mEq Oral BID  . sodium chloride  3 mL Intravenous Q12H  . DISCONTD: azithromycin  500 mg  Intravenous Q24H  . DISCONTD: cefTRIAXone (ROCEPHIN)  IV  1 g Intravenous Q24H   Continuous Infusions:  PRN Meds:.sodium chloride, albuterol, ALPRAZolam, ondansetron (ZOFRAN) IV, ondansetron, sodium chloride, traMADol  ASSESSMENT & PLAN: Principal Problem:  *PNA (pneumonia) Active Problems:  HYPOTHYROIDISM  DEMENTIA  HYPERTENSION  BREAST CANCER, HX OF  COPD (chronic obstructive pulmonary disease)  UTI (urinary tract infection)   Pneumonia -Right lower lobe pneumonia, cannot rule out aspiration because of the age and the location of the pneumonia. -I will ask SLP to evaluate her swallowing. -Meanwhile I will treat as community-acquired pneumonia with Rocephin and azithromycin. -Continue bronchodilators, mucolytics, supplemental oxygen and antipyretics as needed  UTI -Urinalysis sent, no significant colony count -Patient is on Rocephin.  Hypokalemia -This is treated with oral potassium yesterday, potassium level is pending. -Might need more supplementation today.  HTN -Continue preadmission medications. -Blood pressure so far seems very reasonable.  Advanced dementia -Patient is on Namenda and Aricept, I will continue. -High risk for sundowning, confusion and acute delirium in the hospital.  Disposition -Likely back to her nursing home when she gets better.   LOS: 2 days   Tome Wilson A 02/26/2012, 10:04 AM

## 2012-02-26 NOTE — Evaluation (Signed)
Clinical/Bedside Swallow Evaluation Patient Details  Name: [name] MRN: [id number] DOB: 1928/10/14 Today's Date: 02/26/2012  Past Medical History:  Past Medical History  Diagnosis Date  . Asthma   . Hyperlipidemia   . Hypertension   . Hypothyroidism   . Dementia    Past Surgical History:  Past Surgical History  Procedure Date  . Carotid endarterectomy   . Cataract extraction   . Abdominal hysterectomy   . Breast lumpectomy   . Mastectomy     Left, 07/15/2002  . Back surgery    HPI:  78 y/o female admitted to ED from Nursing Home on memory unit with increased SOB, frequent cough, and audible wheezes. CXR: 02/24/12: Patchy airspace disease within the RLL and RUL.  No prior reports of dysphagia in echart.    Assessment/Recommendations/Treatment Plan    SLP Assessment Clinical Impression Statement: Oropharyngeal swallow functional with no clinical s/s of aspiration with all PO trials. Recommend to continue current diet with strict aspiration precautions with full supervision with all meals .  Recommend ST to follow for diet tolerance.  Current CXR indicates possible aspiration however, patient did not exhibit outward s/s of aspiration.    To rule out silent aspiration, an objective assessment of MBS, would need to be completed.  MBS  to be determined at later date .  Family member not present  to provide information on prior diet or history of dyspahgia . RN to page SLP when family arrives.  Risk for Aspiration: Moderate Other Related Risk Factors: History of pneumonia;Lethargy;Cognitive impairment;Decreased respiratory status  Swallow Evaluation Recommendations Recommended Consults: Other (Comment) (Pending family consult) Diet Recommendations: Dysphagia 3 (Mechanical Soft);Thin liquid Liquid Administration via: Cup Medication Administration: Crushed with puree Supervision: Patient able to self feed;Full supervision/cueing for compensatory strategies Compensations: Slow  rate;Small sips/bites;Follow solids with liquid Postural Changes and/or Swallow Maneuvers: Seated upright 90 degrees;Out of bed for meals;Upright 30-60 min after meal Oral Care Recommendations: Oral care before and after PO Other Recommendations: Clarify dietary restrictions Follow up Recommendations: Skilled Nursing facility  Treatment Plan Speech Therapy Frequency: min 2x/week Treatment Duration: 2 weeks Interventions: Aspiration precaution training;Patient/family education;Diet toleration management by SLP  Prognosis Prognosis for Safe Diet Advancement: Good Barriers to Reach Goals: Cognitive deficits  Individuals Consulted Consulted and Agree with Results and Recommendations: Patient;RN  Swallowing Goals  SLP Swallowing Goals Patient will consume recommended diet without observed clinical signs of aspiration with: Moderate assistance Patient will utilize recommended strategies during swallow to increase swallowing safety with: Moderate assistance      General  Date of Onset: 02/24/12 HPI: 78 y/o female admitted to ED from Nursing Home on memory unit with increased SOB, frequent cough, and audible wheezes. CXR: 02/24/12: Patchy airspace disease within the RLL and RUL.  No prior reports of dysphagia in echart.  Type of Study: Bedside swallow evaluation Previous Swallow Assessment: no prior reports. No family present to confirm.  Diet Prior to this Study: Dysphagia 3 (soft);Thin liquids Temperature Spikes Noted: No Respiratory Status: Supplemental O2 delivered via (comment) History of Intubation: No Behavior/Cognition: Lethargic;Requires cueing;Pleasant mood;Cooperative Oral Cavity - Dentition: Dentures, top;Dentures, bottom Vision: Functional for self-feeding Patient Positioning: Upright in bed Baseline Vocal Quality: Clear Volitional Cough: Strong Volitional Swallow: Able to elicit  Oral Motor/Sensory Function  Overall Oral Motor/Sensory Function: Appears within  functional limits for tasks assessed  Consistency Results  Ice Chips Ice chips: Within functional limits  Thin Liquid Thin Liquid: Within functional limits Presentation: Cup;Spoon;Straw  Nectar Thick Liquid Nectar Thick  Liquid: Not tested  Honey Thick Liquid Honey Thick Liquid: Not tested  Puree Puree: Within functional limits Presentation: Spoon  Solid Solid: Impaired Presentation: Self Fed Oral Phase Impairments: Poor awareness of bolus Moreen Fowler MS, CCC-SLP [phone]   [facility] 02/26/2012,3:45 PM

## 2012-02-26 NOTE — Assessment & Plan Note (Signed)
New.  This is my greatest concern for pt.  Has underlying COPD but given diffuse rhonchi, lack of wheezing, lack of response to neb tx in office, mental status changes- I'm highly suspicious of PNA.  Again recommended hospitalization to family due to low O2 but family very resistant to this idea.  Report that she has a mental status decline every time she is admitted and she 'freaks out' b/c of her new environment.  Discussed that her situation is very serious- people this age and w/ her medical problems can deteriorate very quickly and her facility does not have the capability to monitor her as closely as she would need.  Family initially wants her to go home w/ abx and O2.  Discussed that it may be difficult to arrange home O2 at this hour on a Friday and her facility does not have any on site.  Daughter is then in agreement w/ hospitalization but son remains resistant.  Told them that they are free to take her home but they must sign AMA form.  This upsets daughter.  Son remains firm that she should not be hospitalized.  'she could get MRSA'.  Told him that she is just as likely to get MRSA in her nursing home.  He disagrees.  We have no documented POA so at this point, I'm not sure who can make decision and would really prefer for them to agree.  Called Risk Management to discuss the situation.  After continued discussion w/ family they both agree on hospitalization.  EMS called for transport.  Total time spent w/ pt and family 81 minutes.

## 2012-02-26 NOTE — Assessment & Plan Note (Signed)
See above.  O2 did not improve s/p neb tx.  Suspect PNA.

## 2012-02-26 NOTE — Progress Notes (Signed)
Speech Language Pathology Treatment  Patient Details Name: [name] MRN: [id number] DOB: [birth year] Today's Date: 02/26/2012 RN paged treating SLP to return to patient's room as patient's daughter present. Per daughter's report patient was on regular consistency and thin liquids at nursing facility with no prior history of dysphagia.  SLP provided POC and results of swallow evaluation. Patient's daughter stated she was not sure about proceeding with objective evaluation of MBS to assess risk for aspiration and to rule out silent aspiration but agreed to ST treatment for diet tolerance and to provide education to caregivers on swallow precautions to decrease risk of aspiration. Patient's daughter stated that she would consider MBS if necessary. ST to follow.  Moreen Fowler MS, CCC-SLP [phone]    [facility] 02/26/2012, 4:06 PM

## 2012-02-26 NOTE — Assessment & Plan Note (Signed)
New.  Pt has dementia at baseline but family and nursing facility report she has been 'more out of it', has become incontinent, unable to use walker, needing wheelchair.  Likely due to infxn.  Recommended hospitalization to family- see discussion below.

## 2012-02-27 LAB — BASIC METABOLIC PANEL
BUN: 13 mg/dL (ref 6–23)
Calcium: 8.9 mg/dL (ref 8.4–10.5)
GFR calc Af Amer: >90 mL/min (ref >90)
GFR calc non Af Amer: 86 mL/min — ABNORMAL LOW (ref >90)
Potassium: 3.3 mEq/L — ABNORMAL LOW (ref 3.5–5.1)

## 2012-02-27 LAB — CBC
Hemoglobin: 13.3 g/dL (ref 12.0–15.0)
MCH: 28.9 pg (ref 26.0–34.0)
MCHC: 32.6 g/dL (ref 30.0–36.0)
Platelets: 322 10*3/uL (ref 150–400)
RDW: 13.6 % (ref 11.5–15.5)

## 2012-02-27 MED ORDER — DEXTROSE-NACL 5-0.9 % IV SOLN
INTRAVENOUS | Status: DC
  Administered 2012-02-27: 11:00:00 via INTRAVENOUS
  Filled 2012-02-27 (×4): qty 1000

## 2012-02-27 MED ORDER — LORAZEPAM 2 MG/ML IJ SOLN
1.0000 mg | INTRAMUSCULAR | Status: DC | PRN
  Administered 2012-02-27: 1 mg via INTRAVENOUS
  Filled 2012-02-27: qty 1

## 2012-02-27 MED ORDER — POTASSIUM CHLORIDE CRYS ER 20 MEQ PO TBCR: 40.0000 meq | EXTENDED_RELEASE_TABLET | Freq: Two times a day (BID) | ORAL | Status: DC

## 2012-02-27 MED ORDER — POTASSIUM CHLORIDE CRYS ER 20 MEQ PO TBCR
60.0000 meq | EXTENDED_RELEASE_TABLET | Freq: Once | ORAL | Status: AC
  Administered 2012-02-27: 60 meq via ORAL
  Filled 2012-02-27: qty 3

## 2012-02-27 NOTE — Progress Notes (Signed)
DAILY PROGRESS NOTE                              GENERAL INTERNAL MEDICINE [facility]  SUBJECTIVE: Patient is sleepy this morning, hard to arouse. Per nursing staff patient had Ativan this morning, about 3 AM in this is likely the cause of her lethargy.  OBJECTIVE: BP 131/84  Pulse 89  Temp(Src) 98.2 F (36.8 C) (Oral)  Resp 20  Ht 5\' 4"  (1.626 m)  Wt 64 kg (141 lb 1.5 oz)  BMI 24.22 kg/m2  SpO2 98%  Intake/Output Summary (Last 24 hours) at 02/27/12 1018 Last data filed at 02/27/12 0600  Gross per 24 hour  Intake 966.83 ml  Output      2 ml  Net 964.83 ml                      Weight change:  Physical Exam: General: Alert, disoriented not in any acute distress. HEENT: anicteric sclera, pupils equal reactive to light and accommodation CVS: S1-S2 heard, no murmur rubs or gallops Chest: Has rhonchi bilaterally  Abdomen:  normal bowel sounds, soft, nontender, nondistended, no organomegaly Neuro: Awake, follows command rest of exam not done because patient is bedbound Extremities: no cyanosis, no clubbing or edema noted bilaterally   Lab Results:  Basename 02/27/12 0523 02/26/12 1000  NA 138 141  K 3.3* 3.6  CL 101 103  CO2 26 24  GLUCOSE 95 102*  BUN 13 17  CREATININE 0.53 0.55  CALCIUM 8.9 9.0  MG -- --  PHOS -- --    Basename 02/24/12 1736  AST 26  ALT 20  ALKPHOS 62  BILITOT 0.5  PROT 7.2  ALBUMIN 3.3*   No results found for this basename: LIPASE:2,AMYLASE:2 in the last 72 hours  Basename 02/27/12 0523 02/26/12 1000 02/24/12 1736  WBC 8.2 8.3 --  NEUTROABS -- -- 10.5*  HGB 13.3 13.6 --  HCT 40.8 42.4 --  MCV 88.5 90.8 --  PLT 322 261 --   Micro Results: Recent Results (from the past 240 hour(s))  URINE CULTURE     Status: Normal   Collection Time   02/24/12  6:04 PM      Component Value Range Status Comment   Specimen Description URINE, CLEAN CATCH   Final    Special Requests NONE   Final    Culture  Setup Time [id number]   Final    Colony Count 50,000 COLONIES/ML   Final    Culture     Final    Value: Multiple bacterial morphotypes present, none predominant. Suggest appropriate recollection if clinically indicated.   Report Status 02/25/2012 FINAL   Final   MRSA PCR SCREENING     Status: Normal   Collection Time   02/24/12 11:55 PM      Component Value Range Status Comment   MRSA by PCR NEGATIVE  NEGATIVE  Final     Studies/Results: Dg Chest 2 View  02/24/2012  *RADIOLOGY REPORT*  Clinical Data: Cough.  Pneumonia.  Demented.  Question urinary tract infection.  Nonsmoker with COPD.  CHEST - 2 VIEW  Comparison: 12/06/2010  Findings: Lateral view degraded by patient positioning. Hyperinflation.  Lower thoracic compression deformities, suboptimally evaluated.  Surgical clips in the left axilla.  Probable left mastectomy. Midline trachea.  Mild cardiomegaly with atherosclerosis in the transverse aorta.  Probable small right pleural effusion, new. No pneumothorax.  Scarring at  the left lung base.  Patchy airspace disease within the right lower and likely right upper lobes. Suggestion of reticular nodular component in the right upper lobe.  IMPRESSION: Right-sided airspace disease, suspicious for infection or aspiration.  New since 12/06/2010. Recommend radiographic follow-up until clearing.  Probable small right pleural effusion.  Original Report Authenticated By: Consuello Bossier, M.D.   Medications: Scheduled Meds:    . albuterol  2.5 mg Nebulization Q6H  . amLODipine  5 mg Oral Daily  . aspirin  81 mg Oral Daily  . atorvastatin  20 mg Oral q1800  . azithromycin  500 mg Intravenous Q24H  . budesonide-formoterol  1 puff Inhalation BID  . cefTRIAXone (ROCEPHIN)  IV  1 g Intravenous Q24H  . donepezil  10 mg Oral QPM  . levothyroxine  88 mcg Oral QAC breakfast  . meloxicam  7.5 mg Oral Daily  . memantine  10 mg Oral BID  . potassium chloride  40 mEq Oral Once  . potassium chloride  60 mEq Oral Once  . sodium chloride  3 mL  Intravenous Q12H  . DISCONTD: enoxaparin  40 mg Subcutaneous Q24H  . DISCONTD: potassium chloride  40 mEq Oral BID   Continuous Infusions:    . dextrose 5 % and 0.9% NaCl 1,000 mL with potassium chloride 20 mEq infusion     PRN Meds:.sodium chloride, albuterol, ALPRAZolam, ondansetron (ZOFRAN) IV, ondansetron, sodium chloride, traMADol, DISCONTD: LORazepam  ASSESSMENT & PLAN: Principal Problem:  *PNA (pneumonia) Active Problems:  HYPOTHYROIDISM  DEMENTIA  HYPERTENSION  BREAST CANCER, HX OF  COPD (chronic obstructive pulmonary disease)  UTI (urinary tract infection)   Pneumonia -Right lower lobe pneumonia, cannot rule out aspiration because of the age and the location of the pneumonia. -Questionable aspiration, SLP is following. -Meanwhile I will treat as community-acquired pneumonia with Rocephin and azithromycin. -Continue bronchodilators, mucolytics, supplemental oxygen and antipyretics as needed.  UTI -Urinalysis sent, no significant colony count -Patient is on Rocephin.  Hypokalemia -This is treated with oral potassium yesterday. -We'll give more potassium supplementation today.  HTN -Continue preadmission medications. -Blood pressure so far seems very reasonable.  Advanced dementia -Patient is on Namenda and Aricept, I will continue. -High risk for sundowning, confusion and acute delirium in the hospital. -I will try to minimize any sedative medications including benzos and narcotics.  Disposition -Likely back to her nursing home when she gets better.   LOS: 3 days   Johnnell Liou A 02/27/2012, 10:18 AM

## 2012-02-27 NOTE — Progress Notes (Signed)
   CARE MANAGEMENT NOTE 02/27/2012  Patient:  MARCELENE, WEIDEMANN   Account Number:  [id number]  Date Initiated:  02/27/2012  Documentation initiated by:  Letha Cape  Subjective/Objective Assessment:   dx pna  admit- from Memory Unit From [facility].     Action/Plan:   Anticipated DC Date:  02/29/2012   Anticipated DC Plan:  SKILLED NURSING FACILITY  In-house referral  Clinical Social Worker      DC Planning Services  CM consult      Choice offered to / List presented to:             Status of service:  In process, will continue to follow Medicare Important Message given?   (If response is "NO", the following Medicare IM given date fields will be blank) Date Medicare IM given:   Date Additional Medicare IM given:    Discharge Disposition:    Per UR Regulation:    If discussed at Long Length of Stay Meetings, dates discussed:    Comments:  02/27/12 10:38 Letha Cape RN, BSN [phone] patient from [facility] SN[facility] memory unit, CSW referral.

## 2012-02-27 NOTE — Progress Notes (Signed)
Clinical Social Work Department BRIEF PSYCHOSOCIAL ASSESSMENT 02/27/2012  Patient:  [name], [name]     Account Number:  [id number]     Admit date:  02/24/2012  Clinical Social Worker:  Lourdes Sledge  Date/Time:  02/27/2012 11:26 AM  Referred by:  Physician  Date Referred:  02/27/2012 Referred for  SNF Placement   Other Referral:   Interview type:  Family Other interview type:   CSW spoke with pt daughter [name] [phone] in pt room.    PSYCHOSOCIAL DATA Living Status:  FACILITY Admitted from facility:  [facility] Level of care:  Skilled Nursing Facility Primary support name:  [name] Primary support relationship to patient:  CHILD, ADULT Degree of support available:   Pt has been living at [facility] Memory care for 1.5 years. Pt daughter and son involved in pt care.    CURRENT CONCERNS Current Concerns  Post-Acute Placement   Other Concerns:    SOCIAL WORK ASSESSMENT / PLAN CSW completed assessment with pt daughter [name] who was in the room. CSW informed pt has been a resident at [facility] for 1.5 years and the plan is for pt to return when medically stable. CSW left a vm for facility to confirm pt okay to return when stable. CSW will complete FL2.   Assessment/plan status:  Psychosocial Support/Ongoing Assessment of Needs Other assessment/ plan:   Information/referral to community resources:    PATIENT'S/FAMILY'S RESPONSE TO PLAN OF CARE: Pt presents with advanced dementia laying in bed asleep. CSW completed assessment with daughter who would like pt to return back to her facility at dc.        Theresia Bough, MSW, Theresia Majors [phone]

## 2012-02-27 NOTE — Progress Notes (Signed)
0815 Pt very sleepy will arouse but  With difficulty. Dr. Arthor Captain aware and is at the bedside. Patient tolerated small amount  of breakfast. Will continue to monitor.

## 2012-02-27 NOTE — Progress Notes (Signed)
Speech Language Pathology Dysphagia Treatment  Patient Details Name: [name] MRN: [id number] DOB: 1928/07/23 Today's Date: 02/27/2012  SLP Assessment/Plan/Recommendation Assessment / Recommendations / Plan Clinical Impression Statement: SLP attempted to see pt. around 0830 but pt. very lethargic and unable to arouse to safely give po's.  RN reported pt. had been given Ativan around 0300.   Returned this afternoon and pt. lying asleep with piece of cheese in anterior oral cavity which SLP removed food as well as her dentures.  Dentures and oral cavity were cleaned.  She  continues to be sleepy, lethargic, therefore, deemed unsafe to observe with po's at this time.  Made RN and RN tech aware of SLP's findings.    Continue with Current Diet: Regular;Thin liquid;Other (comment) (WHEN ALERT.  MAY NEED TO MODIFY) Liquids provided via: Cup Medication Administration: Crushed with puree Compensations: Slow rate;Small sips/bites;Check for pocketing Postural Changes and/or Swallow Maneuvers: Seated upright 90 degrees (AWAKE AND ALERT FOR MEALS, PLEASE INSERT DENTURES) Oral Care Recommendations: Oral care BID Plan: Continue with current plan of care Swallowing Goals  SLP Swallowing Goals Swallow Study Goal #1 - Progress: Not Met  General Temperature Spikes Noted: No Respiratory Status: Supplemental O2 delivered via (comment) Behavior/Cognition: Lethargic Oral Cavity - Dentition: Dentures, top;Dentures, bottom Patient Positioning: Partially reclined  Oral Cavity - Oral Hygiene Does patient have any of the following "at risk" factors?: Nutritional status - inadequate;Oxygen therapy - cannula, mask, simple oxygen devices Patient is AT RISK - Oral Care Protocol followed (see row info): Yes   Dysphagia Treatment Treatment focused on: Patient/family/caregiver education;Other (comment) (ORAL HYGIENE, REMOVAL OF PO RESIDUAL) Treatment Methods/Modalities: Other (comment) (DETERMINED UNSAFE TO GIVE  POS AT PRESENT) Patient observed directly with PO's: No Reason PO's not observed: Lethargic   Royce Macadamia M.Ed [employer] [phone]  02/27/2012

## 2012-02-27 NOTE — Progress Notes (Signed)
1600 Patient slept most of today turned and repositioned for comfort. Son visited and spoke with Dr. Arthor Captain regarding his mother condition in general.

## 2012-02-28 LAB — BASIC METABOLIC PANEL
Calcium: 9.3 mg/dL (ref 8.4–10.5)
GFR calc Af Amer: >90 mL/min (ref >90)
GFR calc non Af Amer: 87 mL/min — ABNORMAL LOW (ref >90)
Glucose, Bld: 114 mg/dL — ABNORMAL HIGH (ref 70–99)
Potassium: 3.9 mEq/L (ref 3.5–5.1)
Sodium: 141 mEq/L (ref 135–145)

## 2012-02-28 MED ORDER — AZITHROMYCIN 500 MG PO TABS
500.0000 mg | ORAL_TABLET | Freq: Every day | ORAL | Status: DC
  Administered 2012-02-28: 500 mg via ORAL
  Filled 2012-02-28 (×2): qty 1

## 2012-02-28 NOTE — Progress Notes (Signed)
Clinical social worker left message with patient assisted living facility regarding patient returning with home health pt. CSW continuing to follow to assist with pt dc plans.   Catha Gosselin, Theresia Majors  [phone] .02/28/2012 1630pm

## 2012-02-28 NOTE — Evaluation (Signed)
Physical Therapy Evaluation Patient Details Name: [name] MRN: [id number] DOB: 08-01-1928 Today's Date: 02/28/2012 Time: 0347-4259 PT Time Calculation (min): 26 min  PT Assessment / Plan / Recommendation Clinical Impression  Pt adm with PNA.  Pt has advanced dementia but has been ambulatory at memory care unit.  Pt moved fairly well today and appears to be bouncing back fairly quickly and can probably return to memory care unit with HHPT.    PT Assessment  Patient needs continued PT services    Follow Up Recommendations  Home health PT (at memory care unit)    Equipment Recommendations  None recommended by PT    Frequency Min 3X/week    Precautions / Restrictions Precautions Precautions: Fall   Pertinent Vitals/Pain N/A      Mobility  Bed Mobility Bed Mobility: Supine to Sit Supine to Sit: 5: Supervision;HOB elevated (HOB 10 degrees) Details for Bed Mobility Assistance: Pt needed incr time and cues to initiate but no physical asssistance Transfers Transfers: Sit to Stand;Stand to Sit Sit to Stand: 4: Min assist;With upper extremity assist;From bed;From chair/3-in-1;With armrests Stand to Sit: To chair/3-in-1;With upper extremity assist;With armrests;4: Min guard Details for Transfer Assistance: Min A due to slight posterior lean Ambulation/Gait Ambulation/Gait Assistance: 4: Min assist Ambulation Distance (Feet): 8 Feet Assistive device: Rolling walker Gait Pattern: Trunk flexed;Decreased step length - right;Decreased step length - left Gait velocity: Slow pace    Exercises     PT Goals Acute Rehab PT Goals PT Goal Formulation: With patient/family Time For Goal Achievement: 03/06/12 Potential to Achieve Goals: Good Pt will go Sit to Supine/Side: with supervision PT Goal: Sit to Supine/Side - Progress: Goal set today Pt will go Sit to Stand: with supervision;with upper extremity assist PT Goal: Sit to Stand - Progress: Goal set today Pt will go Stand to  Sit: with supervision;with upper extremity assist PT Goal: Stand to Sit - Progress: Goal set today Pt will Ambulate: 51 - 150 feet;with supervision;with least restrictive assistive device PT Goal: Ambulate - Progress: Goal set today  Visit Information  Last PT Received On: 02/28/12 Assistance Needed: +1    Subjective Data  Subjective: "Thank you," pt stated after we were done. Patient Stated Goal: Daughter would like daughter to be able to go back to memory care unit of [facility].   Prior Functioning  Home Living Lives With: Other (Comment) (Memory care unit at [facility] Available Help at Discharge:  (Memory care unit) Type of Home: Assisted living Home Access: Level entry Home Layout: One level Home Adaptive Equipment: Walker - rolling Prior Function Level of Independence: Independent with assistive device(s) Driving: No Vocation: Retired Comments: Was amb independently with rolling walker at the facility. Communication Communication: No difficulties    Cognition  Overall Cognitive Status: History of cognitive impairments - at baseline Arousal/Alertness: Awake/alert (Was asleep but awakened easily.) Behavior During Session: [name] for tasks performed    Extremity/Trunk Assessment Right Lower Extremity Assessment RLE ROM/Strength/Tone: Deficits RLE ROM/Strength/Tone Deficits: grossly 4/5 Left Lower Extremity Assessment LLE ROM/Strength/Tone: Deficits LLE ROM/Strength/Tone Deficits: grossly 4/5   Balance Static Sitting Balance Static Sitting - Balance Support: No upper extremity supported Static Sitting - Level of Assistance: 5: Stand by assistance Static Standing Balance Static Standing - Balance Support: Bilateral upper extremity supported (on walker) Static Standing - Level of Assistance: 4: Min assist  End of Session PT - End of Session Equipment Utilized During Treatment: Gait belt Activity Tolerance: Patient limited by fatigue Patient  left: in chair;with call bell/phone within reach;with chair alarm set Nurse Communication: Mobility status   [name] 02/28/2012, 3:31 PM  [facility] PT [phone]

## 2012-02-28 NOTE — Plan of Care (Signed)
Problem: Phase II Progression Outcomes Goal: Progress activity as tolerated unless otherwise ordered Outcome: Progressing Up with PT in hallway today and up in chair; then to [facility] with assist. Goal: Voiding independently Outcome: Completed/Met Date Met:  02/28/12 Occ. Incontinence.

## 2012-02-28 NOTE — Progress Notes (Signed)
DAILY PROGRESS NOTE                              GENERAL INTERNAL MEDICINE [facility]  SUBJECTIVE: Patient seen this morning, more awake and alert today. Denies any complaints. Seen again in the afternoon, her daughter is at bedside.  OBJECTIVE: BP 138/86  Pulse 105  Temp(Src) 97.7 F (36.5 C) (Oral)  Resp 18  Ht 5\' 4"  (1.626 m)  Wt 64 kg (141 lb 1.5 oz)  BMI 24.22 kg/m2  SpO2 94%  Intake/Output Summary (Last 24 hours) at 02/28/12 1420 Last data filed at 02/28/12 0900  Gross per 24 hour  Intake    990 ml  Output      0 ml  Net    990 ml                      Weight change:  Physical Exam: General: Alert, disoriented not in any acute distress. HEENT: anicteric sclera, pupils equal reactive to light and accommodation CVS: S1-S2 heard, no murmur rubs or gallops Chest: Has rhonchi bilaterally  Abdomen:  normal bowel sounds, soft, nontender, nondistended, no organomegaly Neuro: Awake, follows command rest of exam not done because patient is bedbound Extremities: no cyanosis, no clubbing or edema noted bilaterally   Lab Results:  Basename 02/28/12 1002 02/27/12 0523  NA 141 138  K 3.9 3.3*  CL 104 101  CO2 26 26  GLUCOSE 114* 95  BUN 7 13  CREATININE 0.52 0.53  CALCIUM 9.3 8.9  MG -- --  PHOS -- --   No results found for this basename: AST:2,ALT:2,ALKPHOS:2,BILITOT:2,PROT:2,ALBUMIN:2 in the last 72 hours No results found for this basename: LIPASE:2,AMYLASE:2 in the last 72 hours  Basename 02/27/12 0523 02/26/12 1000  WBC 8.2 8.3  NEUTROABS -- --  HGB 13.3 13.6  HCT 40.8 42.4  MCV 88.5 90.8  PLT 322 261   Micro Results: Recent Results (from the past 240 hour(s))  URINE CULTURE     Status: Normal   Collection Time   02/24/12  6:04 PM      Component Value Range Status Comment   Specimen Description URINE, CLEAN CATCH   Final    Special Requests NONE   Final    Culture  Setup Time [id number]   Final    Colony Count 50,000 COLONIES/ML   Final    Culture     Final    Value: Multiple bacterial morphotypes present, none predominant. Suggest appropriate recollection if clinically indicated.   Report Status 02/25/2012 FINAL   Final   MRSA PCR SCREENING     Status: Normal   Collection Time   02/24/12 11:55 PM      Component Value Range Status Comment   MRSA by PCR NEGATIVE  NEGATIVE  Final     Studies/Results: Dg Chest 2 View  02/24/2012  *RADIOLOGY REPORT*  Clinical Data: Cough.  Pneumonia.  Demented.  Question urinary tract infection.  Nonsmoker with COPD.  CHEST - 2 VIEW  Comparison: 12/06/2010  Findings: Lateral view degraded by patient positioning. Hyperinflation.  Lower thoracic compression deformities, suboptimally evaluated.  Surgical clips in the left axilla.  Probable left mastectomy. Midline trachea.  Mild cardiomegaly with atherosclerosis in the transverse aorta.  Probable small right pleural effusion, new. No pneumothorax.  Scarring at the left lung base.  Patchy airspace disease within the right lower and likely right upper lobes. Suggestion of reticular  nodular component in the right upper lobe.  IMPRESSION: Right-sided airspace disease, suspicious for infection or aspiration.  New since 12/06/2010. Recommend radiographic follow-up until clearing.  Probable small right pleural effusion.  Original Report Authenticated By: Consuello Bossier, M.D.   Medications: Scheduled Meds:    . albuterol  2.5 mg Nebulization Q6H  . amLODipine  5 mg Oral Daily  . aspirin  81 mg Oral Daily  . atorvastatin  20 mg Oral q1800  . azithromycin  500 mg Oral QHS  . budesonide-formoterol  1 puff Inhalation BID  . cefTRIAXone (ROCEPHIN)  IV  1 g Intravenous Q24H  . donepezil  10 mg Oral QPM  . levothyroxine  88 mcg Oral QAC breakfast  . meloxicam  7.5 mg Oral Daily  . memantine  10 mg Oral BID  . sodium chloride  3 mL Intravenous Q12H  . DISCONTD: azithromycin  500 mg Intravenous Q24H   Continuous Infusions:    . dextrose 5 % and 0.9% NaCl  1,000 mL with potassium chloride 20 mEq infusion 50 mL/hr at 02/27/12 1100   PRN Meds:.sodium chloride, albuterol, ALPRAZolam, ondansetron (ZOFRAN) IV, ondansetron, sodium chloride, traMADol  ASSESSMENT & PLAN: Principal Problem:  *PNA (pneumonia) Active Problems:  HYPOTHYROIDISM  DEMENTIA  HYPERTENSION  BREAST CANCER, HX OF  COPD (chronic obstructive pulmonary disease)  UTI (urinary tract infection)   Pneumonia -Right lower lobe pneumonia, cannot rule out aspiration because of the age and the location of the pneumonia. -Questionable aspiration, SLP is following. -Meanwhile I will treat as community-acquired pneumonia with Rocephin and azithromycin. -Continue bronchodilators, mucolytics, supplemental oxygen and antipyretics as needed.  UTI -Urinalysis sent, no significant colony count. -Patient is on Rocephin.  Hypokalemia -This is treated with oral potassium yesterday. -We'll give more potassium supplementation today.  HTN -Continue preadmission medications. -Blood pressure so far seems very reasonable.  Advanced dementia -Patient is on Namenda and Aricept, I will continue. -High risk for sundowning, confusion and acute delirium in the hospital. -I will try to minimize any sedative medications including benzos and narcotics.  Rash -Unclear etiology to me, around the both knees, I don't think it's related to adverse drug effect. -Patient denies any history of eczema/psoriasis or rash before. -Recommended to followup closely, treat empirically as allergy, if it's getting worse she might need dermatology as outpatient.  Disposition -Likely back to SNF in am.   LOS: 4 days   [name] 02/28/2012, 2:20 PM

## 2012-02-28 NOTE — Progress Notes (Signed)
Speech Language Pathology Dysphagia Treatment  Patient Details Name: [name] MRN: [id number] DOB: 01/10/1928 Today's Date: 02/28/2012  SLP Assessment/Plan/Recommendation Assessment / Recommendations / Plan Clinical Impression Statement: Observed RN tech feeding pt. breakfast.  SLP donned dentures  Pt. awake but keeps eyes closed.  Consistent coughing after solids, mostly delayed, indicative of possible penetration after the swallow.  RN tech reported pt. has not been coughing with meals nor did she during the initial swallow assessment.  Pt. required verbal and visual cues to implement  tongue sweeps to remove mild bilateral pocketing.  Verbal Jill Alexanders cues provided for pt. to keep head in a neutral position versus extended and explained importance and rationale to tech.  Lungs sounds are diminished and  pt. is afebrile.  SLP will speak with RN tech after lunch today and will modify plan if continued s/s aspiration are present.  Continue with Current Diet: Dysphagia 3 (mechanical soft);Thin liquid Liquids provided via: Cup Medication Administration: Crushed with puree Supervision: Full supervision/cueing for compensatory strategies Compensations: Slow rate;Small sips/bites Postural Changes and/or Swallow Maneuvers: Seated upright 90 degrees Oral Care Recommendations: Oral care BID Plan: Continue with current plan of care Swallowing Goals  SLP Swallowing Goals Patient will consume recommended diet without observed clinical signs of aspiration with: Moderate cueing Patient will utilize recommended strategies during swallow to increase swallowing safety with: Moderate cueing  General Temperature Spikes Noted: No Respiratory Status: Supplemental O2 delivered via (comment) Behavior/Cognition: Alert;Cooperative Oral Cavity - Dentition: Dentures, top;Dentures, bottom Patient Positioning: Upright in bed  Oral Cavity - Oral Hygiene Does patient have any of the following "at risk" factors?:  Oxygen therapy - cannula, mask, simple oxygen devices Patient is AT RISK - Oral Care Protocol followed (see row info): Yes   Dysphagia Treatment Treatment focused on: Patient/family/caregiver education;Skilled observation of diet tolerance;Facilitation of oral phase;Utilization of compensatory strategies Treatment Methods/Modalities: Skilled observation Feeding: Needs assist Liquids provided via: Cup Oral Phase Signs & Symptoms: Right pocketing;Left pocketing Pharyngeal Phase Signs & Symptoms: Delayed cough;Audible swallow Type of cueing: Verbal;Visual Amount of cueing: Moderate   Breck Coons [place].Ed [employer] [phone]  02/28/2012

## 2012-02-29 MED ORDER — AMOXICILLIN-POT CLAVULANATE 875-125 MG PO TABS: 1.0000 | ORAL_TABLET | Freq: Two times a day (BID) | ORAL | Status: AC

## 2012-02-29 MED ORDER — ALPRAZOLAM 0.5 MG PO TABS: 0.2500 mg | ORAL_TABLET | Freq: Three times a day (TID) | ORAL | Status: DC | PRN

## 2012-02-29 NOTE — Discharge Summary (Addendum)
PATIENT DETAILS Name: [name] Age: 78 y.o. Sex: female Date of Birth: [birth year] MRN: [id number]. Admit Date: 02/24/2012 Admitting Physician: Houston Siren, MD WUJ:[id number] Lowne, DO, DO  PRIMARY DISCHARGE DIAGNOSIS:  Principal Problem:  *PNA (pneumonia) Active Problems:  HYPOTHYROIDISM  DEMENTIA  HYPERTENSION  BREAST CANCER, HX OF  COPD (chronic obstructive pulmonary disease)  UTI (urinary tract infection)      PAST MEDICAL HISTORY: Past Medical History  Diagnosis Date  . Asthma   . Hyperlipidemia   . Hypertension   . Hypothyroidism   . Dementia     DISCHARGE MEDICATIONS: Medication List  As of 02/29/2012 10:49 AM   TAKE these medications         albuterol 108 (90 BASE) MCG/ACT inhaler   Commonly known as: PROVENTIL HFA;VENTOLIN HFA   Inhale 2 puffs into the lungs every 6 (six) hours as needed. For wheezing      ALPRAZolam 0.5 MG tablet   Commonly known as: XANAX   Take 0.5 tablets (0.25 mg total) by mouth every 8 (eight) hours as needed for anxiety. For anxiety      amLODipine 5 MG tablet   Commonly known as: NORVASC   Take 5 mg by mouth daily.      amoxicillin-clavulanate 875-125 MG per tablet   Commonly known as: AUGMENTIN   Take 1 tablet by mouth 2 (two) times daily.      aspirin 81 MG chewable tablet   Chew 81 mg by mouth daily.      budesonide-formoterol 160-4.5 MCG/ACT inhaler   Commonly known as: SYMBICORT   Inhale 1 puff into the lungs 2 (two) times daily. Gargle & spit after use      donepezil 10 MG tablet   Commonly known as: ARICEPT   Take 10 mg by mouth every evening.      guaiFENesin 600 MG 12 hr tablet   Commonly known as: MUCINEX   Take 600 mg by mouth every 12 (twelve) hours as needed. For chest congestion      guaiFENesin 100 MG/5ML Soln   Commonly known as: ROBITUSSIN   Take 10 mLs by mouth every 6 (six) hours as needed. For cough      ipratropium-albuterol 0.5-2.5 (3) MG/3ML Soln   Commonly known as: DUONEB   Take 3 mLs by  nebulization every 6 (six) hours as needed. For wheezing      levothyroxine 88 MCG tablet   Commonly known as: SYNTHROID, LEVOTHROID   Take 88 mcg by mouth daily.      meclizine 25 MG tablet   Commonly known as: ANTIVERT   Take 25 mg by mouth every 6 (six) hours as needed. For vertigo      meloxicam 7.5 MG tablet   Commonly known as: MOBIC   Take 7.5 mg by mouth daily. Take with food      memantine 10 MG tablet   Commonly known as: NAMENDA   Take 10 mg by mouth 2 (two) times daily.      pravastatin 80 MG tablet   Commonly known as: PRAVACHOL   Take 80 mg by mouth at bedtime.      traMADol 50 MG tablet   Commonly known as: ULTRAM   Take 50 mg by mouth every 6 (six) hours as needed. Maximum dose= 8 tablets per day. For pain             BRIEF HPI:  See H&P, Labs, Consult and Test reports for all details in brief,  [name] is an 78 y.o. female with history of advanced dementia, resident of memory unit, history of asthma, hypothyroidism and hypertension, brought in by family because she has been having increase shortness of breath frequent cough and audible wheezes. Family also states that she has incontinence and this is new for her. She has advanced dementia and does not give any meaningful history. Evaluation in the emergency room included a urinalysis which showed moderate leukocytews, 21-50 WBC, and bacteria. Her chest x-ray showed right-sided pulmonary infiltrate consistent with pneumonia or aspiration. She has a leukocytosis with a white count of 14.4 thousand, normal lactate level, and unremarkable renal function tests. Hospitalist was asked to admit her for pneumonia and UTI.    CONSULTATIONS:   None  PERTINENT RADIOLOGIC STUDIES: Dg Chest 2 View  02/24/2012  *RADIOLOGY REPORT*  Clinical Data: Cough.  Pneumonia.  Demented.  Question urinary tract infection.  Nonsmoker with COPD.  CHEST - 2 VIEW  Comparison: 12/06/2010  Findings: Lateral view degraded by patient  positioning. Hyperinflation.  Lower thoracic compression deformities, suboptimally evaluated.  Surgical clips in the left axilla.  Probable left mastectomy. Midline trachea.  Mild cardiomegaly with atherosclerosis in the transverse aorta.  Probable small right pleural effusion, new. No pneumothorax.  Scarring at the left lung base.  Patchy airspace disease within the right lower and likely right upper lobes. Suggestion of reticular nodular component in the right upper lobe.  IMPRESSION: Right-sided airspace disease, suspicious for infection or aspiration.  New since 12/06/2010. Recommend radiographic follow-up until clearing.  Probable small right pleural effusion.  Original Report Authenticated By: Consuello Bossier, M.D.     PERTINENT LAB RESULTS: CBC:  Basename 02/27/12 0523  WBC 8.2  HGB 13.3  HCT 40.8  PLT 322   CMET CMP     Component Value Date/Time   NA 141 02/28/2012 1002   K 3.9 02/28/2012 1002   CL 104 02/28/2012 1002   CO2 26 02/28/2012 1002   GLUCOSE 114* 02/28/2012 1002   GLUCOSE 94 11/15/2006 0955   BUN 7 02/28/2012 1002   CREATININE 0.52 02/28/2012 1002   CALCIUM 9.3 02/28/2012 1002   PROT 7.2 02/24/2012 1736   ALBUMIN 3.3* 02/24/2012 1736   AST 26 02/24/2012 1736   ALT 20 02/24/2012 1736   ALKPHOS 62 02/24/2012 1736   BILITOT 0.5 02/24/2012 1736   GFRNONAA 87* 02/28/2012 1002   GFRAA >90 02/28/2012 1002    GFR Estimated Creatinine Clearance: 46.8 ml/min (by C-G formula based on Cr of 0.52). No results found for this basename: LIPASE:2,AMYLASE:2 in the last 72 hours No results found for this basename: CKTOTAL:3,CKMB:3,CKMBINDEX:3,TROPONINI:3 in the last 72 hours No components found with this basename: POCBNP:3 No results found for this basename: DDIMER:2 in the last 72 hours No results found for this basename: HGBA1C:2 in the last 72 hours No results found for this basename: CHOL:2,HDL:2,LDLCALC:2,TRIG:2,CHOLHDL:2,LDLDIRECT:2 in the last 72 hours No results found for this  basename: TSH,T4TOTAL,FREET3,T3FREE,THYROIDAB in the last 72 hours No results found for this basename: VITAMINB12:2,FOLATE:2,FERRITIN:2,TIBC:2,IRON:2,RETICCTPCT:2 in the last 72 hours Coags: No results found for this basename: PT:2,INR:2 in the last 72 hours Microbiology: Recent Results (from the past 240 hour(s))  URINE CULTURE     Status: Normal   Collection Time   02/24/12  6:04 PM      Component Value Range Status Comment   Specimen Description URINE, CLEAN CATCH   Final    Special Requests NONE   Final    Culture  Setup  Time [id number]   Final    Colony Count 50,000 COLONIES/ML   Final    Culture     Final    Value: Multiple bacterial morphotypes present, none predominant. Suggest appropriate recollection if clinically indicated.   Report Status 02/25/2012 FINAL   Final   MRSA PCR SCREENING     Status: Normal   Collection Time   02/24/12 11:55 PM      Component Value Range Status Comment   MRSA by PCR NEGATIVE  NEGATIVE  Final      BRIEF HOSPITAL COURSE:  Principal Problem: Pneumonia  -Right lower lobe pneumonia,Likely aspiration because of the age and the location of the pneumonia.  -Speech Therapy following, recommending a Dys 3 diet, discussed with Daughter-Nancy Idolina Primer, she is accepting risks of aspiration and willing to continue with Dysphagia 3 diet. -She was treated with Rocephin and azithromycin while inpatient, and she did make clinical improvement, and will be transitioned to augmentin on discharge-for 5 more days -Continue bronchodilators, mucolytics, supplemental oxygen and antipyretics as needed.  UTI  -Urinalysis sent, no significant colony count on urine culture -will be discharged on augmentin anyways-as noted above  Hypokalemia  -this was repleted  HTN  -Continue preadmission medications.  -Blood pressure so far seems very reasonable.  Advanced dementia  -Patient is on Namenda and Aricept, I will continue.  -High risk for sundowning, confusion  and acute delirium in the hospital.  -I will try to minimize any sedative medications including benzos and narcotics.   Rash  -Unclear etiology to me, around the both knees,?fixed drug reaction, per daughter this is significantly better today -Recommended to followup closely, treat empirically as allergy, if it's getting worse she might need dermatology as outpatient.  TODAY-DAY OF DISCHARGE:  Subjective:   [place] today  Is very close to her usual baseline, she is conversant and can answer some questions appropriately.She has had no fever and her lungs are clear to exam.  Objective:   Blood pressure 116/78, pulse 96, temperature 98.2 F (36.8 C), temperature source Oral, resp. rate 16, height 5\' 4"  (1.626 m), weight 64 kg (141 lb 1.5 oz), SpO2 93.00%.  Intake/Output Summary (Last 24 hours) at 02/29/12 1049 Last data filed at 02/29/12 0900  Gross per 24 hour  Intake   1518 ml  Output    200 ml  Net   1318 ml    Exam Awake and Alert at times [place].AT,PERRAL Supple Neck,No JVD, No cervical lymphadenopathy appriciated.  Symmetrical Chest wall movement, Good air movement bilaterally, CTAB RRR,No Gallops,Rubs or new Murmurs, No Parasternal Heave +ve B.Sounds, Abd Soft, Non tender, No organomegaly appriciated, No rebound -guarding or rigidity. No Cyanosis, Clubbing or edema, No new Rash or bruise  DISPOSITION: Back to her memory care unit with HHPT   DISCHARGE INSTRUCTIONS:    Follow-up Information    Follow up with Loreen Freud, DO. Schedule an appointment as soon as possible for a visit in 1 week.   Contact information:   [address] [place] [address] [phone]         Total Time spent on discharge equals 45 minutes.  SignedJeoffrey Massed 02/29/2012 10:49 AM

## 2012-02-29 NOTE — Progress Notes (Signed)
02/29/2012 Pt dressed and ready to discharge.  PT did not see today. Rollene Rotunda Nikiya Starn, PT, DPT [phone]

## 2012-02-29 NOTE — Progress Notes (Signed)
.  Clinical social worker assisted with patient discharge to assisted living facility, [facility] the [facility]. Patient transportation provided by pt family with alf discharge packet and pt prescriptions. .No further Clinical Social Work needs, signing off.   Catha Gosselin, Theresia Majors  [phone] .02/29/2012 1539pm

## 2012-02-29 NOTE — Progress Notes (Signed)
1515 Discharge packet given to daughter by Child psychotherapist.Patient discharge to [facility] . Transported via family vehicle son  And daughter. Skin with redden fading rash on knees.

## 2012-02-29 NOTE — Progress Notes (Signed)
   CARE MANAGEMENT NOTE 02/29/2012  Patient:  [name], [name]   Account Number:  [id number]  Date Initiated:  02/27/2012  Documentation initiated by:  Letha Cape  Subjective/Objective Assessment:   dx pna  admit- from Memory Unit From [facility].     Action/Plan:   PT eval ordered   Anticipated DC Date:  02/29/2012   Anticipated DC Plan:  SKILLED NURSING FACILITY  In-house referral  Clinical Social Worker      DC Planning Services  CM consult      Choice offered to / List presented to:          HH arranged  HH-2 PT      HH agency  [facility]   Status of service:  Completed, signed off Medicare Important Message given?   (If response is "NO", the following Medicare IM given date fields will be blank) Date Medicare IM given:   Date Additional Medicare IM given:    Discharge Disposition:  ASSISTED LIVING  Per UR Regulation:    If discussed at Long Length of Stay Meetings, dates discussed:    Comments:  02/29/12- 1215- Donn Pierini RN, BSN [phone] Plan for discharge today back to [facility], CSW following for discharge needs.  02/27/12 10:38 Letha Cape RN, BSN [phone] patient from [facility] SN[facility] memory unit, CSW referral.

## 2012-02-29 NOTE — Progress Notes (Signed)
Clinical social worker confirmed with facility that patient can return to alf when medically stable. CSW faxed d/c summary, signed fl2 with medications, and avs report. CSW to continue to follow to assist with pt dc to return to alf. Pt family has requested to provide patient transportation to facility.   Catha Gosselin, Theresia Majors  [phone] .02/29/2012 1151am

## 2012-02-29 NOTE — Progress Notes (Signed)
OT Note: OT consult received and appreciated. Noted pt. Returning to memory care unit shortly and will sign off acutely.   Cassandria Anger, OTR/L Pager: [phone]  02/29/2012

## 2012-11-06 DIAGNOSIS — Z0279 Encounter for issue of other medical certificate: Secondary | ICD-10-CM

## 2013-01-11 ENCOUNTER — Emergency Department (HOSPITAL_COMMUNITY)
Admission: EM | Admit: 2013-01-11 | Discharge: 2013-01-11 | Disposition: A | Discharge status: Home / Self Care | Payer: Medicare Other | Attending: Emergency Medicine | Admitting: Emergency Medicine

## 2013-01-11 ENCOUNTER — Emergency Department (HOSPITAL_COMMUNITY): Payer: Medicare Other

## 2013-01-11 DIAGNOSIS — Z87891 Personal history of nicotine dependence: Secondary | ICD-10-CM | POA: Insufficient documentation

## 2013-01-11 DIAGNOSIS — E039 Hypothyroidism, unspecified: Secondary | ICD-10-CM | POA: Insufficient documentation

## 2013-01-11 DIAGNOSIS — E860 Dehydration: Secondary | ICD-10-CM | POA: Insufficient documentation

## 2013-01-11 DIAGNOSIS — E785 Hyperlipidemia, unspecified: Secondary | ICD-10-CM | POA: Insufficient documentation

## 2013-01-11 DIAGNOSIS — R197 Diarrhea, unspecified: Secondary | ICD-10-CM | POA: Insufficient documentation

## 2013-01-11 DIAGNOSIS — Z7982 Long term (current) use of aspirin: Secondary | ICD-10-CM | POA: Insufficient documentation

## 2013-01-11 DIAGNOSIS — J45909 Unspecified asthma, uncomplicated: Secondary | ICD-10-CM | POA: Insufficient documentation

## 2013-01-11 DIAGNOSIS — R112 Nausea with vomiting, unspecified: Secondary | ICD-10-CM | POA: Insufficient documentation

## 2013-01-11 DIAGNOSIS — Z79899 Other long term (current) drug therapy: Secondary | ICD-10-CM | POA: Insufficient documentation

## 2013-01-11 DIAGNOSIS — F0390 Unspecified dementia without behavioral disturbance: Secondary | ICD-10-CM | POA: Insufficient documentation

## 2013-01-11 DIAGNOSIS — I10 Essential (primary) hypertension: Secondary | ICD-10-CM | POA: Insufficient documentation

## 2013-01-11 LAB — CBC WITH DIFFERENTIAL/PLATELET
Eosinophils Absolute: 0.1 10*3/uL (ref 0.0–0.7)
Lymphocytes Relative: 24 % (ref 12–46)
Lymphs Abs: 1.3 10*3/uL (ref 0.7–4.0)
MCH: 29.5 pg (ref 26.0–34.0)
Neutro Abs: 3 10*3/uL (ref 1.7–7.7)
Neutrophils Relative %: 56 % (ref 43–77)
Platelets: 195 10*3/uL (ref 150–400)
RBC: 4.58 MIL/uL (ref 3.87–5.11)
WBC: 5.3 10*3/uL (ref 4.0–10.5)

## 2013-01-11 LAB — URINALYSIS, ROUTINE W REFLEX MICROSCOPIC
Bilirubin Urine: NEGATIVE
Hgb urine dipstick: NEGATIVE
Specific Gravity, Urine: 1.019 (ref 1.005–1.030)
Urobilinogen, UA: 0.2 mg/dL (ref 0.0–1.0)
pH: 5.5 (ref 5.0–8.0)

## 2013-01-11 LAB — COMPREHENSIVE METABOLIC PANEL
ALT: 16 U/L (ref 0–35)
Alkaline Phosphatase: 60 U/L (ref 39–117)
Chloride: 102 mEq/L (ref 96–112)
GFR calc Af Amer: 90 mL/min — ABNORMAL LOW (ref >90)
Glucose, Bld: 90 mg/dL (ref 70–99)
Potassium: 3.4 mEq/L — ABNORMAL LOW (ref 3.5–5.1)
Sodium: 140 mEq/L (ref 135–145)
Total Protein: 7 g/dL (ref 6.0–8.3)

## 2013-01-11 LAB — URINE MICROSCOPIC-ADD ON

## 2013-01-11 MED ORDER — SODIUM CHLORIDE 0.9 % IV BOLUS (SEPSIS)
1000.0000 mL | Freq: Once | INTRAVENOUS | Status: AC
  Administered 2013-01-11: 1000 mL via INTRAVENOUS

## 2013-01-11 MED ORDER — ALPRAZOLAM 0.5 MG PO TABS
0.5000 mg | ORAL_TABLET | Freq: Once | ORAL | Status: AC
  Administered 2013-01-11: 0.5 mg via ORAL
  Filled 2013-01-11: qty 1

## 2013-01-11 MED ORDER — ONDANSETRON 4 MG PO TBDP: ORAL_TABLET | ORAL | Status: DC

## 2013-01-11 MED ORDER — ONDANSETRON HCL 4 MG/2ML IJ SOLN
4.0000 mg | Freq: Once | INTRAMUSCULAR | Status: AC
  Administered 2013-01-11: 4 mg via INTRAVENOUS
  Filled 2013-01-11: qty 2

## 2013-01-11 MED ORDER — POTASSIUM CHLORIDE CRYS ER 20 MEQ PO TBCR
20.0000 meq | EXTENDED_RELEASE_TABLET | Freq: Once | ORAL | Status: AC
  Administered 2013-01-11: 20 meq via ORAL
  Filled 2013-01-11: qty 1

## 2013-01-11 NOTE — ED Notes (Signed)
[id number]<AV> Expected date:<BR> Expected time:<BR> Means of arrival:<BR> Comments:<BR> 79yo-f/ with n/v/d

## 2013-01-11 NOTE — ED Notes (Signed)
Two failed IV attempst- IV team called.

## 2013-01-11 NOTE — ED Provider Notes (Signed)
History     CSN: [id number]  Arrival date & time 01/11/13  1549   First MD Initiated Contact with Patient 01/11/13 1608      Chief Complaint  Patient presents with  . Emesis  . Diarrhea  . Nausea    (Consider location/radiation/quality/duration/timing/severity/associated sxs/prior treatment) The history is provided by the patient, a relative and the nursing home.  [name] is a 79 y.o. female history of dementia, asthma, hypertension here presenting with vomiting and diarrhea. She currently resides at a nursing home and there may be a noro virus outbreak. History she's been having some vomiting and loose stools. She says she feels better today. She wants go back to the nursing home. She is demented and unable to answer many questions. History as per her son and nursing home.   Level V caveat- dementia    Past Medical History  Diagnosis Date  . Asthma   . Hyperlipidemia   . Hypertension   . Hypothyroidism   . Dementia     Past Surgical History  Procedure Laterality Date  . Carotid endarterectomy    . Cataract extraction    . Abdominal hysterectomy    . Breast lumpectomy    . Mastectomy      Left, 07/15/2002  . Back surgery      No family history on file.  History  Substance Use Topics  . Smoking status: Former Games developer  . Smokeless tobacco: Never Used     Comment: 10 YEARS AGO AS OF 2012  . Alcohol Use: No    OB History   Grav Para Term Preterm Abortions TAB SAB Ect Mult Living                  Review of Systems  Gastrointestinal: Positive for vomiting and diarrhea.  All other systems reviewed and are negative.    Allergies  Review of patient's allergies indicates no known allergies.  Home Medications   Current Outpatient Rx  Name  Route  Sig  Dispense  Refill  . albuterol (PROVENTIL HFA;VENTOLIN HFA) 108 (90 BASE) MCG/ACT inhaler   Inhalation   Inhale 2 puffs into the lungs every 6 (six) hours as needed. For wheezing         .  ALPRAZolam (XANAX) 0.5 MG tablet   Oral   Take 0.5 tablets (0.25 mg total) by mouth every 8 (eight) hours as needed for anxiety. For anxiety   10 tablet   0   . amLODipine (NORVASC) 5 MG tablet   Oral   Take 5 mg by mouth daily.           Marland Kitchen aspirin 81 MG chewable tablet   Oral   Chew 81 mg by mouth daily.         . budesonide-formoterol (SYMBICORT) 160-4.5 MCG/ACT inhaler   Inhalation   Inhale 1 puff into the lungs 2 (two) times daily. Gargle & spit after use         . donepezil (ARICEPT) 10 MG tablet   Oral   Take 10 mg by mouth every evening.          Marland Kitchen guaiFENesin (MUCINEX) 600 MG 12 hr tablet   Oral   Take 600 mg by mouth every 12 (twelve) hours as needed. For chest congestion         . guaiFENesin (ROBITUSSIN) 100 MG/5ML SOLN   Oral   Take 10 mLs by mouth every 6 (six) hours as needed. For cough         .  ipratropium-albuterol (DUONEB) 0.5-2.5 (3) MG/3ML SOLN   Nebulization   Take 3 mLs by nebulization every 6 (six) hours as needed. For wheezing         . levothyroxine (SYNTHROID, LEVOTHROID) 88 MCG tablet   Oral   Take 88 mcg by mouth daily.           . meclizine (ANTIVERT) 25 MG tablet   Oral   Take 25 mg by mouth every 6 (six) hours as needed. For vertigo         . meloxicam (MOBIC) 7.5 MG tablet   Oral   Take 7.5 mg by mouth daily. Take with food         . memantine (NAMENDA) 10 MG tablet   Oral   Take 10 mg by mouth 2 (two) times daily.           . pravastatin (PRAVACHOL) 80 MG tablet   Oral   Take 80 mg by mouth at bedtime.          . traMADol (ULTRAM) 50 MG tablet   Oral   Take 50 mg by mouth every 6 (six) hours as needed. Maximum dose= 8 tablets per day. For pain           There were no vitals taken for this visit.  Physical Exam  Nursing note and vitals reviewed. Constitutional: She appears well-developed.  Demented   HENT:  Head: Normocephalic.  Mouth/Throat: Oropharynx is clear and moist.  Eyes:  Conjunctivae are normal. Pupils are equal, round, and reactive to light.  Neck: Normal range of motion. Neck supple.  Cardiovascular: Normal rate, regular rhythm and normal heart sounds.   Pulmonary/Chest: Effort normal and breath sounds normal. No respiratory distress. She has no wheezes. She has no rales.  Abdominal: Soft. Bowel sounds are normal. She exhibits no distension. There is no tenderness. There is no rebound and no guarding.  Musculoskeletal: Normal range of motion.  Neurological: She is alert.  Demented, NL strength and sensation throughout   Skin: Skin is warm and dry.  Psychiatric: She has a normal mood and affect. Her behavior is normal. Judgment and thought content normal.    ED Course  Procedures (including critical care time)  Labs Reviewed  CBC WITH DIFFERENTIAL - Abnormal; Notable for the following:    Monocytes Relative 18 (*)    All other components within normal limits  COMPREHENSIVE METABOLIC PANEL - Abnormal; Notable for the following:    Potassium 3.4 (*)    Total Bilirubin 0.2 (*)    GFR calc non Af Amer 77 (*)    GFR calc Af Amer 90 (*)    All other components within normal limits  URINALYSIS, ROUTINE W REFLEX MICROSCOPIC - Abnormal; Notable for the following:    Leukocytes, UA SMALL (*)    All other components within normal limits  URINE MICROSCOPIC-ADD ON - Abnormal; Notable for the following:    Squamous Epithelial / LPF FEW (*)    Bacteria, UA FEW (*)    All other components within normal limits  URINE CULTURE  TROPONIN I  LIPASE, BLOOD   Dg Abd Acute W/chest  01/11/2013  *RADIOLOGY REPORT*  Clinical Data: Nausea/vomiting/diarrhea  ACUTE ABDOMEN SERIES (ABDOMEN 2 VIEW & CHEST 1 VIEW)  Comparison: Chest radiographs dated 02/24/2012  Findings: Chronic interstitial markings/emphysematous changes. Possible mild residual scarring in the right lower lobe.  Suspected calcified granuloma in the right lower lobe.  Nodular opacity overlying the left lower  lobe reflects  an overlying chest lead.  No pleural effusion or pneumothorax.  The heart is normal in size.  Surgical clips in the left chest wall/axilla.  Nonobstructive bowel gas pattern.  No evidence of free air on the lateral decubitus view.  IMPRESSION: No evidence of acute cardiopulmonary disease.  No evidence of small bowel obstruction or free air.   Original Report Authenticated By: Charline Bills, M.D.      No diagnosis found.   Date: 01/11/2013  Rate: 98  Rhythm: normal sinus rhythm  QRS Axis: normal  Intervals: normal  ST/T Wave abnormalities: nonspecific ST changes  Conduction Disutrbances:right bundle branch block  Narrative Interpretation:   Old EKG Reviewed: unchanged    MDM  [name] is a 79 y.o. female here with vomiting, loose stools. Will check labs, hydrate patient. Will get screening EKG. Will reassess after fluids.   7:20 PM EKG unremarkable. UA contaminated, culture sent. Won't give abx for now. Labs unremarkable except K 3.4, supplemented. Given NS 1L. Tolerated PO fluids. Stable to d/c back to facility.         Richardean Canal, MD 01/11/13 [phone]

## 2013-01-11 NOTE — ED Notes (Signed)
Per EMS:  Pt arrived from [facility].  Two days of N/V/D that started 3/5-3-6 and has resolved as of today.  Pt has a cough and rhonchi per EMS.  Her son requested evaluation by ED.  No pre-treatment en route.  VS WNL.

## 2013-01-13 LAB — URINE CULTURE

## 2013-01-15 ENCOUNTER — Ambulatory Visit (INDEPENDENT_AMBULATORY_CARE_PROVIDER_SITE_OTHER): Payer: Medicare Other | Admitting: Family Medicine

## 2013-01-15 ENCOUNTER — Encounter: Payer: Self-pay | Admitting: Family Medicine

## 2013-01-15 VITALS — BP 110/68 | HR 75 | Temp 99.5°F | Wt 143.8 lb

## 2013-01-15 DIAGNOSIS — F068 Other specified mental disorders due to known physiological condition: Secondary | ICD-10-CM

## 2013-01-15 DIAGNOSIS — E039 Hypothyroidism, unspecified: Secondary | ICD-10-CM

## 2013-01-15 DIAGNOSIS — J209 Acute bronchitis, unspecified: Secondary | ICD-10-CM

## 2013-01-15 DIAGNOSIS — I10 Essential (primary) hypertension: Secondary | ICD-10-CM

## 2013-01-15 DIAGNOSIS — E785 Hyperlipidemia, unspecified: Secondary | ICD-10-CM

## 2013-01-15 LAB — LIPID PANEL
Cholesterol: 99 mg/dL (ref 0–200)
HDL: 41.3 mg/dL (ref >39.00)
VLDL: 12.2 mg/dL (ref 0.0–40.0)

## 2013-01-15 LAB — HEPATIC FUNCTION PANEL
AST: 21 U/L (ref 0–37)
Albumin: 3.6 g/dL (ref 3.5–5.2)

## 2013-01-15 LAB — CBC WITH DIFFERENTIAL/PLATELET
Basophils Absolute: 0 10*3/uL (ref 0.0–0.1)
Eosinophils Absolute: 0.1 10*3/uL (ref 0.0–0.7)
HCT: 39.1 % (ref 36.0–46.0)
Hemoglobin: 13.3 g/dL (ref 12.0–15.0)
Lymphs Abs: 1.6 10*3/uL (ref 0.7–4.0)
MCHC: 33.9 g/dL (ref 30.0–36.0)
MCV: 88.4 fl (ref 78.0–100.0)
Neutro Abs: 6.2 10*3/uL (ref 1.4–7.7)
RDW: 13.2 % (ref 11.5–14.6)

## 2013-01-15 MED ORDER — AZITHROMYCIN 250 MG PO TABS: ORAL_TABLET | ORAL | Status: DC

## 2013-01-15 NOTE — Assessment & Plan Note (Signed)
Check labs con't meds 

## 2013-01-15 NOTE — Assessment & Plan Note (Signed)
Stable con't meds 

## 2013-01-15 NOTE — Progress Notes (Signed)
  Subjective:    Patient ID: [name], female    DOB: 03/14/1928, 79 y.o.   MRN: [id number]  HPI Pt here with her daughter to have paper work filled out for new NH.  Pt also has cough that is wet for a few weeks.   She is getting her nebs but it does not seen to be helping.   Review of Systems As above    Objective:   Physical Exam  BP 110/68  Pulse 75  Temp(Src) 99.5 F (37.5 C) (Oral)  Wt 143 lb 12.8 oz (65.227 kg)  BMI 24.67 kg/m2  SpO2 95% General appearance: cooperative, appears stated age and no distress Lungs: rhonchi bilaterally Heart: S1, S2 normal Ext--no CCE      Assessment & Plan:

## 2013-01-15 NOTE — Assessment & Plan Note (Signed)
FL 2 filled out  See meds

## 2013-01-15 NOTE — Assessment & Plan Note (Signed)
z pak con't nebs  rto prn

## 2013-01-15 NOTE — Patient Instructions (Signed)

## 2013-01-17 ENCOUNTER — Telehealth: Payer: Self-pay

## 2013-01-17 MED ORDER — METRONIDAZOLE 500 MG PO TABS: 500.0000 mg | ORAL_TABLET | Freq: Two times a day (BID) | ORAL | Status: DC

## 2013-01-17 NOTE — Telephone Encounter (Signed)
Order from [facility] advising resident has a vaginal discharge that is yellowish and has an odor. Per Dr.Lowne UA and microscopic and flagyl 500 bis x's 7. Orders and Rx faxed      KP

## 2013-01-21 ENCOUNTER — Telehealth: Payer: Self-pay | Admitting: *Deleted

## 2013-01-21 NOTE — Telephone Encounter (Signed)
i did those last week

## 2013-01-21 NOTE — Telephone Encounter (Signed)
Labs left on the ledge to be completed.     KP

## 2013-01-21 NOTE — Telephone Encounter (Incomplete)
i already saw

## 2013-01-21 NOTE — Telephone Encounter (Signed)
VM left wanting to know status of forms.Please advise

## 2013-01-21 NOTE — Telephone Encounter (Addendum)
i have seen labs and signed off on them already.

## 2013-01-22 NOTE — Telephone Encounter (Signed)
Forms complete Harriett Sine aware ready for pick up.

## 2013-02-04 ENCOUNTER — Telehealth: Payer: Self-pay | Admitting: *Deleted

## 2013-02-04 MED ORDER — ONDANSETRON HCL 4 MG PO TABS: 4.0000 mg | ORAL_TABLET | ORAL | Status: DC | PRN

## 2013-02-04 NOTE — Telephone Encounter (Signed)
yes

## 2013-02-04 NOTE — Telephone Encounter (Signed)
Zofran sublingual not covered by Pt insurance. Ok to change Pt to the tablet.Please advise

## 2013-02-04 NOTE — Telephone Encounter (Signed)
Rx faxed.    KP 

## 2013-02-05 ENCOUNTER — Telehealth: Payer: Self-pay | Admitting: *Deleted

## 2013-02-05 NOTE — Telephone Encounter (Signed)
Vm left stating that a FL2 was just completed for Pt however we did not include Mucinex. Pt needs orders to be faxed to [facility] at [phone] that states to adminster Mucinex prn.Please advise

## 2013-02-05 NOTE — Telephone Encounter (Signed)
Ok to send rx mucinex er 1 po bid prn

## 2013-02-06 NOTE — Telephone Encounter (Signed)
Rx already on Medication list... I printed and faxed the medication list.     KP

## 2013-02-07 ENCOUNTER — Telehealth: Payer: Self-pay | Admitting: Family Medicine

## 2013-02-07 MED ORDER — ONDANSETRON HCL 4 MG PO TABS: 4.0000 mg | ORAL_TABLET | ORAL | Status: DC | PRN

## 2013-02-07 NOTE — Telephone Encounter (Signed)
Collin from [facility] in [place] called stating they still have not received rx for regular zofran tab for this patient. Please fax to [phone]

## 2013-02-12 ENCOUNTER — Ambulatory Visit (INDEPENDENT_AMBULATORY_CARE_PROVIDER_SITE_OTHER): Payer: Medicare Other | Admitting: Family Medicine

## 2013-02-12 ENCOUNTER — Encounter: Payer: Self-pay | Admitting: Family Medicine

## 2013-02-12 VITALS — BP 118/64 | HR 74 | Temp 98.4°F | Wt 147.2 lb

## 2013-02-12 DIAGNOSIS — L02419 Cutaneous abscess of limb, unspecified: Secondary | ICD-10-CM | POA: Insufficient documentation

## 2013-02-12 DIAGNOSIS — L03119 Cellulitis of unspecified part of limb: Secondary | ICD-10-CM

## 2013-02-12 DIAGNOSIS — Z23 Encounter for immunization: Secondary | ICD-10-CM

## 2013-02-12 DIAGNOSIS — L0291 Cutaneous abscess, unspecified: Secondary | ICD-10-CM

## 2013-02-12 DIAGNOSIS — L0390 Cellulitis, unspecified: Secondary | ICD-10-CM

## 2013-02-12 MED ORDER — CEPHALEXIN 500 MG PO CAPS: 500.0000 mg | ORAL_CAPSULE | Freq: Two times a day (BID) | ORAL | Status: DC

## 2013-02-12 NOTE — Assessment & Plan Note (Signed)
Keflex 500 mg bid for 10 days  home health to go to pt for wound care Keep elevated rto prn

## 2013-02-12 NOTE — Addendum Note (Signed)
Addended by: Arnette Norris on: 02/12/2013 06:19 PM   Modules accepted: Orders

## 2013-02-12 NOTE — Progress Notes (Signed)
  Subjective:    Patient ID: [name], female    DOB: Oct 17, 1928, 79 y.o.   MRN: [id number]  HPI Pt here with her son c/o reddness in low R leg for few days.   No other complaints.  No calf pain.    No cp or sob.   Review of Systems    as above Objective:   Physical Exam  BP 118/64  Pulse 74  Temp(Src) 98.4 F (36.9 C) (Oral)  Wt 147 lb 3.2 oz (66.769 kg)  BMI 25.25 kg/m2  SpO2 95% General appearance: cooperative, appears stated age and no distress Extremities: edema tr pitting and Homans sign is negative, no sign of DVT                        + errythema with minor scratch on ant low leg      Assessment & Plan:

## 2013-02-14 DIAGNOSIS — Z0279 Encounter for issue of other medical certificate: Secondary | ICD-10-CM

## 2013-02-21 ENCOUNTER — Telehealth: Payer: Self-pay

## 2013-02-21 MED ORDER — LEVOFLOXACIN 500 MG PO TABS: 500.0000 mg | ORAL_TABLET | Freq: Every day | ORAL | Status: DC

## 2013-02-21 NOTE — Telephone Encounter (Signed)
Call from Lillian at [facility], she is providing wound care for the patient and stated this is day 9 of the Keflex and the patient has 10 days but there is not much improvement. She would like to get something else called into the pharmacy. Per Dr.Lowne RX levaquin 500 1 po qd X's 10 days. Bonita Quin has been made aware and voiced understanding. Rx faxed to [facility] in .    KP

## 2013-02-25 ENCOUNTER — Telehealth: Payer: Self-pay | Admitting: Family Medicine

## 2013-02-25 NOTE — Telephone Encounter (Signed)
[facility] Triage Call Report Triage Record Num: [id number] Operator: Ether Griffins Patient Name: [name] Call Date & Time: 02/21/2013 5:44:58PM Patient Phone: PCP: Lelon Perla Patient Gender: Female PCP Fax : [phone] Patient DOB: Nov 13, 1927 Practice Name: [facility] Reason for Call: Linda/home health nurse from [facility] calling to request order to d/c Keflex be faxed to [facility]. Dr Laury Axon ordered Levaquin to be started today because Keflex was not helping wound, but no order was given to d/c Keflex. Contacted Dr Vladimir Creeks approved order to d/c Keflex. Called Carlena Hurl at the facility to make her aware that fax will be sent. Faxed order to d/c Keflex to [facility]--[phone]. Protocol(s) Used: Office Note Recommended Outcome per Protocol: Information Noted and Sent to Office Reason for Outcome: Caller information to office Care Advice: ~

## 2013-02-26 DIAGNOSIS — L03119 Cellulitis of unspecified part of limb: Secondary | ICD-10-CM

## 2013-02-26 DIAGNOSIS — L02419 Cutaneous abscess of limb, unspecified: Secondary | ICD-10-CM

## 2013-02-26 DIAGNOSIS — J449 Chronic obstructive pulmonary disease, unspecified: Secondary | ICD-10-CM

## 2013-02-26 DIAGNOSIS — I10 Essential (primary) hypertension: Secondary | ICD-10-CM

## 2013-02-26 DIAGNOSIS — F0390 Unspecified dementia without behavioral disturbance: Secondary | ICD-10-CM

## 2013-02-28 ENCOUNTER — Encounter (HOSPITAL_COMMUNITY): Payer: Self-pay | Admitting: Emergency Medicine

## 2013-02-28 ENCOUNTER — Emergency Department (HOSPITAL_COMMUNITY)
Admission: EM | Admit: 2013-02-28 | Discharge: 2013-02-28 | Disposition: A | Discharge status: Home / Self Care | Payer: Medicare Other | Attending: Emergency Medicine | Admitting: Emergency Medicine

## 2013-02-28 ENCOUNTER — Telehealth: Payer: Self-pay

## 2013-02-28 DIAGNOSIS — Z7982 Long term (current) use of aspirin: Secondary | ICD-10-CM | POA: Insufficient documentation

## 2013-02-28 DIAGNOSIS — M7989 Other specified soft tissue disorders: Secondary | ICD-10-CM | POA: Insufficient documentation

## 2013-02-28 DIAGNOSIS — E039 Hypothyroidism, unspecified: Secondary | ICD-10-CM | POA: Insufficient documentation

## 2013-02-28 DIAGNOSIS — E785 Hyperlipidemia, unspecified: Secondary | ICD-10-CM | POA: Insufficient documentation

## 2013-02-28 DIAGNOSIS — Z87891 Personal history of nicotine dependence: Secondary | ICD-10-CM | POA: Insufficient documentation

## 2013-02-28 DIAGNOSIS — Z79899 Other long term (current) drug therapy: Secondary | ICD-10-CM | POA: Insufficient documentation

## 2013-02-28 DIAGNOSIS — J441 Chronic obstructive pulmonary disease with (acute) exacerbation: Secondary | ICD-10-CM | POA: Insufficient documentation

## 2013-02-28 DIAGNOSIS — F0390 Unspecified dementia without behavioral disturbance: Secondary | ICD-10-CM | POA: Insufficient documentation

## 2013-02-28 DIAGNOSIS — I10 Essential (primary) hypertension: Secondary | ICD-10-CM | POA: Insufficient documentation

## 2013-02-28 DIAGNOSIS — L02419 Cutaneous abscess of limb, unspecified: Secondary | ICD-10-CM

## 2013-02-28 HISTORY — DX: Chronic obstructive pulmonary disease, unspecified: J44.9

## 2013-02-28 LAB — CBC WITH DIFFERENTIAL/PLATELET
Basophils Absolute: 0 10*3/uL (ref 0.0–0.1)
HCT: 41.2 % (ref 36.0–46.0)
Hemoglobin: 13.7 g/dL (ref 12.0–15.0)
Lymphocytes Relative: 24 % (ref 12–46)
Monocytes Absolute: 1 10*3/uL (ref 0.1–1.0)
Monocytes Relative: 15 % — ABNORMAL HIGH (ref 3–12)
Neutro Abs: 3.5 10*3/uL (ref 1.7–7.7)
Neutrophils Relative %: 55 % (ref 43–77)
RDW: 13.4 % (ref 11.5–15.5)
WBC: 6.4 10*3/uL (ref 4.0–10.5)

## 2013-02-28 LAB — COMPREHENSIVE METABOLIC PANEL
AST: 18 U/L (ref 0–37)
Albumin: 3.4 g/dL — ABNORMAL LOW (ref 3.5–5.2)
Alkaline Phosphatase: 56 U/L (ref 39–117)
CO2: 30 mEq/L (ref 19–32)
Chloride: 102 mEq/L (ref 96–112)
Creatinine, Ser: 0.63 mg/dL (ref 0.50–1.10)
GFR calc non Af Amer: 81 mL/min — ABNORMAL LOW (ref >90)
Potassium: 3.4 mEq/L — ABNORMAL LOW (ref 3.5–5.1)
Total Bilirubin: 0.2 mg/dL — ABNORMAL LOW (ref 0.3–1.2)

## 2013-02-28 LAB — PROTIME-INR: INR: 0.89 (ref 0.00–1.49)

## 2013-02-28 NOTE — ED Notes (Signed)
Pt complains of right leg pain and swelling x 3 weeks. Pt currently receiving antibiotics, "but it is getting no better"

## 2013-02-28 NOTE — ED Notes (Signed)
Vascular reports ETA 25-33mins.

## 2013-02-28 NOTE — Progress Notes (Signed)
*  PRELIMINARY RESULTS* Vascular Ultrasound Right lower extremity venous duplex has been completed.  Preliminary findings: Right:  No evidence of DVT, superficial thrombosis, or Baker's cyst.   Farrel Demark, RDMS, RVT  02/28/2013, 7:36 PM

## 2013-02-28 NOTE — Telephone Encounter (Signed)
Call from [facility] from [facility] and she stated the patient cellulitis is getting worst and she is not responding to the Levaquin. She was previously on Keflex with no reponse The cellulitis is now weeping and moving up the leg toward the thigh. She wants to know what you want to do. She will also need a Prn order because she went out to see the patient yesterday. Please advise      KP

## 2013-02-28 NOTE — ED Provider Notes (Signed)
History     CSN: [id number]  Arrival date & time 02/28/13  1557   First MD Initiated Contact with Patient 02/28/13 1631      Chief Complaint  Patient presents with  . Leg Swelling    (Consider location/radiation/quality/duration/timing/severity/associated sxs/prior treatment) HPI Pt has 3 weeks of RLE swelling, redness and "weeping". Family states she has been on Keflex and then 8 days of Levaquin. States the redness appears improved today. No vomiting, fever, c/o pain. Pt has severe dementia and level 5 caveat applies.  Past Medical History  Diagnosis Date  . Asthma   . Hyperlipidemia   . Hypertension   . Hypothyroidism   . Dementia   . COPD (chronic obstructive pulmonary disease)     Past Surgical History  Procedure Laterality Date  . Carotid endarterectomy    . Cataract extraction    . Abdominal hysterectomy    . Breast lumpectomy    . Mastectomy      Left, 07/15/2002  . Back surgery      No family history on file.  History  Substance Use Topics  . Smoking status: Former Games developer  . Smokeless tobacco: Never Used     Comment: 10 YEARS AGO AS OF 2012  . Alcohol Use: No    OB History   Grav Para Term Preterm Abortions TAB SAB Ect Mult Living                  Review of Systems  Unable to perform ROS: Dementia    Allergies  Review of patient's allergies indicates no known allergies.  Home Medications   Current Outpatient Rx  Name  Route  Sig  Dispense  Refill  . albuterol (PROVENTIL HFA;VENTOLIN HFA) 108 (90 BASE) MCG/ACT inhaler   Inhalation   Inhale 2 puffs into the lungs every 6 (six) hours as needed for wheezing or shortness of breath.          . ALPRAZolam (XANAX) 1 MG tablet   Oral   Take 1 mg by mouth 3 (three) times daily as needed for anxiety.         Marland Kitchen amLODipine (NORVASC) 5 MG tablet   Oral   Take 5 mg by mouth daily.           Marland Kitchen aspirin EC 81 MG tablet   Oral   Take 81 mg by mouth daily.         . budesonide-formoterol  (SYMBICORT) 160-4.5 MCG/ACT inhaler   Inhalation   Inhale 1 puff into the lungs 2 (two) times daily. Gargle & spit after use         . donepezil (ARICEPT) 10 MG tablet   Oral   Take 10 mg by mouth at bedtime.          Marland Kitchen guaiFENesin (MUCINEX) 600 MG 12 hr tablet   Oral   Take 600 mg by mouth every 12 (twelve) hours as needed for congestion.          Marland Kitchen ipratropium-albuterol (DUONEB) 0.5-2.5 (3) MG/3ML SOLN   Nebulization   Take 3 mLs by nebulization every 6 (six) hours as needed (for wheezing).          Marland Kitchen levofloxacin (LEVAQUIN) 500 MG tablet   Oral   Take 500 mg by mouth daily. 10 day course of therapy started 02/21/13.         Marland Kitchen levothyroxine (SYNTHROID, LEVOTHROID) 88 MCG tablet   Oral   Take 88 mcg by mouth daily.           Marland Kitchen  meclizine (ANTIVERT) 25 MG tablet   Oral   Take 25 mg by mouth every 6 (six) hours as needed for dizziness.          . meloxicam (MOBIC) 7.5 MG tablet   Oral   Take 7.5 mg by mouth daily. Take with food         . memantine (NAMENDA) 10 MG tablet   Oral   Take 10 mg by mouth 2 (two) times daily.           . ondansetron (ZOFRAN) 4 MG tablet   Oral   Take 1 tablet (4 mg total) by mouth every 4 (four) hours as needed for nausea.   20 tablet   0   . pravastatin (PRAVACHOL) 80 MG tablet   Oral   Take 80 mg by mouth at bedtime.          . traMADol (ULTRAM) 50 MG tablet   Oral   Take 50 mg by mouth every 6 (six) hours as needed. Maximum dose= 8 tablets per day. For pain           BP 155/73  Pulse 84  Temp(Src) 98.3 F (36.8 C) (Oral)  Resp 18  SpO2 93%  Physical Exam  Nursing note and vitals reviewed. Constitutional: She appears well-developed and well-nourished. No distress.  HENT:  Head: Normocephalic and atraumatic.  Mouth/Throat: Oropharynx is clear and moist.  Eyes: EOM are normal. Pupils are equal, round, and reactive to light.  Neck: Normal range of motion. Neck supple.  Cardiovascular: Normal rate and  regular rhythm.   Pulmonary/Chest: Effort normal. No respiratory distress. She has wheezes (mild scattered exp wheezes). She has no rales.  Abdominal: Soft. Bowel sounds are normal. She exhibits no distension and no mass. There is no tenderness. There is no rebound and no guarding.  Musculoskeletal: Normal range of motion. She exhibits edema (3+ pitting edema RLE to knee with erythema and warmth extending above knee. No clear demarkation. 2+dp pulses. No calf tenderness. Mutiple healing vesicles over tibial area). She exhibits no tenderness.  Neurological: She is alert.  Follows simple commands. No focal deficit.   Skin: Skin is warm and dry. No rash noted. There is erythema.    ED Course  Procedures (including critical care time)  Labs Reviewed  CBC WITH DIFFERENTIAL - Abnormal; Notable for the following:    Monocytes Relative 15 (*)    Eosinophils Relative 6 (*)    All other components within normal limits  COMPREHENSIVE METABOLIC PANEL - Abnormal; Notable for the following:    Potassium 3.4 (*)    Albumin 3.4 (*)    Total Bilirubin 0.2 (*)    GFR calc non Af Amer 81 (*)    All other components within normal limits  PROTIME-INR  APTT   No results found.   1. Cellulitis and abscess of leg   2. Swelling of right lower extremity       MDM    Negative workup. Swelling likely due to vascular insuff or healing cellulitis. Advised to stay on abx and f/u with PMD.       Loren Racer, MD 02/28/13 [phone]

## 2013-02-28 NOTE — Telephone Encounter (Signed)
I spoke with [facility] and she stated it does look bad enough to go to the ED but she has seen worst, she is concerned because there has been no improvement. I called and spoke with Cheryl Flash and he agreed to take the patient to the hospital.     KP

## 2013-02-28 NOTE — ED Notes (Signed)
Family concerned about the ETA of vascular for venous duplex procedure.  Vascular paged, patient advocate sent in to talk with pt.  Pt is aware that vascular tecs travel between campuses.

## 2013-02-28 NOTE — Telephone Encounter (Signed)
  Does it look bad enough that pt may need to go to ER to be admitted for IV abx? Ok to give prn order

## 2013-03-08 ENCOUNTER — Encounter: Payer: Self-pay | Admitting: Family Medicine

## 2013-03-08 ENCOUNTER — Ambulatory Visit (INDEPENDENT_AMBULATORY_CARE_PROVIDER_SITE_OTHER): Payer: Medicare Other | Admitting: Family Medicine

## 2013-03-08 VITALS — BP 140/72 | HR 71 | Temp 98.5°F | Wt 147.2 lb

## 2013-03-08 DIAGNOSIS — R609 Edema, unspecified: Secondary | ICD-10-CM

## 2013-03-08 DIAGNOSIS — R062 Wheezing: Secondary | ICD-10-CM

## 2013-03-08 MED ORDER — ALBUTEROL SULFATE (5 MG/ML) 0.5% IN NEBU
2.5000 mg | INHALATION_SOLUTION | Freq: Once | RESPIRATORY_TRACT | Status: AC
  Administered 2013-03-08: 2.5 mg via RESPIRATORY_TRACT

## 2013-03-08 MED ORDER — FUROSEMIDE 20 MG PO TABS: 20.0000 mg | ORAL_TABLET | Freq: Every day | ORAL | Status: DC

## 2013-03-08 MED ORDER — IPRATROPIUM BROMIDE 0.02 % IN SOLN
0.5000 mg | Freq: Once | RESPIRATORY_TRACT | Status: AC
  Administered 2013-03-08: 0.5 mg via RESPIRATORY_TRACT

## 2013-03-08 NOTE — Progress Notes (Signed)
  Subjective:    [name] is a 79 y.o. female who presents for evaluation of edema in both lower legs and the right lower leg   Cellultitis as improved.   The edema has been moderate. Onset of symptoms was several weeks ago, and patient reports symptoms have gradually worsened since that time. The edema is present all day. The patient states the problem is new. The swelling has been aggravated by dependency of involved area. The swelling has been relieved by elevation of involved area. Associated factors include: nothing. Cardiac risk factors: advanced age (older than 59 for men, 5 for women), hypertension and sedentary lifestyle.  The following portions of the patient's history were reviewed and updated as appropriate: allergies, current medications, past family history, past medical history, past social history, past surgical history and problem list.  Review of Systems Pertinent items are noted in HPI.   Objective:    BP 140/72  Pulse 71  Temp(Src) 98.5 F (36.9 C) (Oral)  Wt 147 lb 3.2 oz (66.769 kg)  BMI 25.25 kg/m2  SpO2 98% General appearance: alert, cooperative, appears stated age and no distress Lungs: clear to auscultation bilaterally and was wheezing on arrival ---neb given after about 1/2 pt stopped it and lungs were clear Heart: S1, S2 normal Extremities: edema + pitting edema R leg leg   Cardiographics ECG: not done  Imaging Chest x-ray: not indicated   Assessment:     Edema secondary to dependency Cellulitis--  resolved.    Plan:    Recommendations: decrease sodium in the diet and elevate feet above the level of the heart whenever possible. The patient was also instructed to call IMMEDIATELY (i.e., day or night) if any cardiopulmonary symptoms occur, especially chest pain, shortness of breath, dyspnea on exertion, paroxysmal nocturnal dyspnea, or orthopnea, and these were explained. Follow up in 2 weeks and as needed.  Lasix 20 mg daily Drink oj or eat  1/2 banana daily

## 2013-03-08 NOTE — Patient Instructions (Signed)
Edema Edema is an abnormal build-up of fluids in tissues. Because this is partly dependent on gravity (water flows to the lowest place), it is more common in the legs and thighs (lower extremities). It is also common in the looser tissues, like around the eyes. Painless swelling of the feet and ankles is common and increases as a person ages. It may affect both legs and may include the calves or even thighs. When squeezed, the fluid may move out of the affected area and may leave a dent for a few moments. CAUSES   Prolonged standing or sitting in one place for extended periods of time. Movement helps pump tissue fluid into the veins, and absence of movement prevents this, resulting in edema.  Varicose veins. The valves in the veins do not work as well as they should. This causes fluid to leak into the tissues.  Fluid and salt overload.  Injury, burn, or surgery to the leg, ankle, or foot, may damage veins and allow fluid to leak out.  Sunburn damages vessels. Leaky vessels allow fluid to go out into the sunburned tissues.  Allergies (from insect bites or stings, medications or chemicals) cause swelling by allowing vessels to become leaky.  Protein in the blood helps keep fluid in your vessels. Low protein, as in malnutrition, allows fluid to leak out.  Hormonal changes, including pregnancy and menstruation, cause fluid retention. This fluid may leak out of vessels and cause edema.  Medications that cause fluid retention. Examples are sex hormones, blood pressure medications, steroid treatment, or anti-depressants.  Some illnesses cause edema, especially heart failure, kidney disease, or liver disease.  Surgery that cuts veins or lymph nodes, such as surgery done for the heart or for breast cancer, may result in edema. DIAGNOSIS  Your caregiver is usually easily able to determine what is causing your swelling (edema) by simply asking what is wrong (getting a history) and examining you (doing  a physical). Sometimes x-rays, EKG (electrocardiogram or heart tracing), and blood work may be done to evaluate for underlying medical illness. TREATMENT  General treatment includes:  Leg elevation (or elevation of the affected body part).  Restriction of fluid intake.  Prevention of fluid overload.  Compression of the affected body part. Compression with elastic bandages or support stockings squeezes the tissues, preventing fluid from entering and forcing it back into the blood vessels.  Diuretics (also called water pills or fluid pills) pull fluid out of your body in the form of increased urination. These are effective in reducing the swelling, but can have side effects and must be used only under your caregiver's supervision. Diuretics are appropriate only for some types of edema. The specific treatment can be directed at any underlying causes discovered. Heart, liver, or kidney disease should be treated appropriately. HOME CARE INSTRUCTIONS   Elevate the legs (or affected body part) above the level of the heart, while lying down.  Avoid sitting or standing still for prolonged periods of time.  Avoid putting anything directly under the knees when lying down, and do not wear constricting clothing or garters on the upper legs.  Exercising the legs causes the fluid to work back into the veins and lymphatic channels. This may help the swelling go down.  The pressure applied by elastic bandages or support stockings can help reduce ankle swelling.  A low-salt diet may help reduce fluid retention and decrease the ankle swelling.  Take any medications exactly as prescribed. SEEK MEDICAL CARE IF:  Your edema is   not responding to recommended treatments. SEEK IMMEDIATE MEDICAL CARE IF:   You develop shortness of breath or chest pain.  You cannot breathe when you lay down; or if, while lying down, you have to get up and go to the window to get your breath.  You are having increasing  swelling without relief from treatment.  You develop a fever over 102 F (38.9 C).  You develop pain or redness in the areas that are swollen.  Tell your caregiver right away if you have gained 3 lb/1.4 kg in 1 day or 5 lb/2.3 kg in a week. MAKE SURE YOU:   Understand these instructions.  Will watch your condition.  Will get help right away if you are not doing well or get worse. Document Released: 10/24/2005 Document Revised: 04/24/2012 Document Reviewed: 06/11/2008 Exit[employer] Patient Information 2013 [employer].  

## 2013-03-11 ENCOUNTER — Other Ambulatory Visit: Payer: Self-pay | Admitting: Family Medicine

## 2013-03-11 ENCOUNTER — Telehealth: Payer: Self-pay | Admitting: *Deleted

## 2013-03-11 MED ORDER — MEMANTINE HCL ER 28 MG PO CP24: 28.0000 mg | ORAL_CAPSULE | Freq: Every day | ORAL | Status: DC

## 2013-03-11 NOTE — Telephone Encounter (Signed)
I will call in a prescription for the Namenda, 28 mg XR tablet.

## 2013-03-11 NOTE — Telephone Encounter (Signed)
Patient's daughter called stating she has received a letter from the patient's insurance carrier will be discontinuing Namenda. The pharmacist suggest switching to Namenda XR so that she doesn't have any interruption in her treatment. Patient lives at [facility] now.

## 2013-03-19 ENCOUNTER — Telehealth: Payer: Self-pay | Admitting: *Deleted

## 2013-03-19 MED ORDER — PRAVASTATIN SODIUM 80 MG PO TABS: 80.0000 mg | ORAL_TABLET | Freq: Every day | ORAL | Status: DC

## 2013-03-19 MED ORDER — DONEPEZIL HCL 10 MG PO TABS: 10.0000 mg | ORAL_TABLET | Freq: Every day | ORAL | Status: DC

## 2013-03-19 NOTE — Telephone Encounter (Signed)
Rx sent 

## 2013-03-19 NOTE — Telephone Encounter (Signed)
Refill x 6 months 

## 2013-03-19 NOTE — Telephone Encounter (Signed)
Lst OV 03-08-13, No refill history .Please advise

## 2013-03-20 ENCOUNTER — Telehealth: Payer: Self-pay | Admitting: General Practice

## 2013-03-20 MED ORDER — MEDICAL COMPRESSION STOCKINGS MISC: 1.0000 | Status: DC

## 2013-03-20 NOTE — Telephone Encounter (Signed)
Darvin Neighbours, nurse for [facility], called today. Wanted to advise that pt had been seen in office by Dr. Laury Axon regarding her Cellulitis. States that she cannot see a major change in this besides he legs have stopped weeping and the heat has diminished. States taht pt is not very compliant with elevating legs due to dementia. Would like to know if we could place an order for compression stockings. Please advise can reach [facility] either at [phone] or [phone].

## 2013-03-20 NOTE — Telephone Encounter (Signed)
Spoke with [name] and it is ok to fax the order to [phone]                KP

## 2013-03-20 NOTE — Telephone Encounter (Signed)
Ok to put order in for compression stockings

## 2013-03-22 DIAGNOSIS — L03119 Cellulitis of unspecified part of limb: Secondary | ICD-10-CM

## 2013-03-22 DIAGNOSIS — I10 Essential (primary) hypertension: Secondary | ICD-10-CM

## 2013-03-22 DIAGNOSIS — L02419 Cutaneous abscess of limb, unspecified: Secondary | ICD-10-CM

## 2013-03-22 DIAGNOSIS — J449 Chronic obstructive pulmonary disease, unspecified: Secondary | ICD-10-CM

## 2013-03-22 DIAGNOSIS — F0390 Unspecified dementia without behavioral disturbance: Secondary | ICD-10-CM

## 2013-03-25 ENCOUNTER — Encounter: Payer: Self-pay | Admitting: Family Medicine

## 2013-03-25 ENCOUNTER — Ambulatory Visit (INDEPENDENT_AMBULATORY_CARE_PROVIDER_SITE_OTHER): Payer: Medicare Other | Admitting: Family Medicine

## 2013-03-25 VITALS — BP 122/82 | HR 72 | Temp 98.1°F | Wt 145.0 lb

## 2013-03-25 DIAGNOSIS — E876 Hypokalemia: Secondary | ICD-10-CM

## 2013-03-25 DIAGNOSIS — R609 Edema, unspecified: Secondary | ICD-10-CM

## 2013-03-25 LAB — BASIC METABOLIC PANEL
BUN: 24 mg/dL — ABNORMAL HIGH (ref 6–23)
CO2: 30 mEq/L (ref 19–32)
Chloride: 98 mEq/L (ref 96–112)
Creatinine, Ser: 0.9 mg/dL (ref 0.4–1.2)
Glucose, Bld: 96 mg/dL (ref 70–99)
Potassium: 3.5 mEq/L (ref 3.5–5.1)

## 2013-03-25 NOTE — Assessment & Plan Note (Signed)
Elevated legs Con' t lasix Check bmp rto 3 months or sooner prn

## 2013-03-25 NOTE — Patient Instructions (Signed)
Edema Edema is an abnormal build-up of fluids in tissues. Because this is partly dependent on gravity (water flows to the lowest place), it is more common in the legs and thighs (lower extremities). It is also common in the looser tissues, like around the eyes. Painless swelling of the feet and ankles is common and increases as a person ages. It may affect both legs and may include the calves or even thighs. When squeezed, the fluid may move out of the affected area and may leave a dent for a few moments. CAUSES   Prolonged standing or sitting in one place for extended periods of time. Movement helps pump tissue fluid into the veins, and absence of movement prevents this, resulting in edema.  Varicose veins. The valves in the veins do not work as well as they should. This causes fluid to leak into the tissues.  Fluid and salt overload.  Injury, burn, or surgery to the leg, ankle, or foot, may damage veins and allow fluid to leak out.  Sunburn damages vessels. Leaky vessels allow fluid to go out into the sunburned tissues.  Allergies (from insect bites or stings, medications or chemicals) cause swelling by allowing vessels to become leaky.  Protein in the blood helps keep fluid in your vessels. Low protein, as in malnutrition, allows fluid to leak out.  Hormonal changes, including pregnancy and menstruation, cause fluid retention. This fluid may leak out of vessels and cause edema.  Medications that cause fluid retention. Examples are sex hormones, blood pressure medications, steroid treatment, or anti-depressants.  Some illnesses cause edema, especially heart failure, kidney disease, or liver disease.  Surgery that cuts veins or lymph nodes, such as surgery done for the heart or for breast cancer, may result in edema. DIAGNOSIS  Your caregiver is usually easily able to determine what is causing your swelling (edema) by simply asking what is wrong (getting a history) and examining you (doing  a physical). Sometimes x-rays, EKG (electrocardiogram or heart tracing), and blood work may be done to evaluate for underlying medical illness. TREATMENT  General treatment includes:  Leg elevation (or elevation of the affected body part).  Restriction of fluid intake.  Prevention of fluid overload.  Compression of the affected body part. Compression with elastic bandages or support stockings squeezes the tissues, preventing fluid from entering and forcing it back into the blood vessels.  Diuretics (also called water pills or fluid pills) pull fluid out of your body in the form of increased urination. These are effective in reducing the swelling, but can have side effects and must be used only under your caregiver's supervision. Diuretics are appropriate only for some types of edema. The specific treatment can be directed at any underlying causes discovered. Heart, liver, or kidney disease should be treated appropriately. HOME CARE INSTRUCTIONS   Elevate the legs (or affected body part) above the level of the heart, while lying down.  Avoid sitting or standing still for prolonged periods of time.  Avoid putting anything directly under the knees when lying down, and do not wear constricting clothing or garters on the upper legs.  Exercising the legs causes the fluid to work back into the veins and lymphatic channels. This may help the swelling go down.  The pressure applied by elastic bandages or support stockings can help reduce ankle swelling.  A low-salt diet may help reduce fluid retention and decrease the ankle swelling.  Take any medications exactly as prescribed. SEEK MEDICAL CARE IF:  Your edema is   not responding to recommended treatments. SEEK IMMEDIATE MEDICAL CARE IF:   You develop shortness of breath or chest pain.  You cannot breathe when you lay down; or if, while lying down, you have to get up and go to the window to get your breath.  You are having increasing  swelling without relief from treatment.  You develop a fever over 102 F (38.9 C).  You develop pain or redness in the areas that are swollen.  Tell your caregiver right away if you have gained 3 lb/1.4 kg in 1 day or 5 lb/2.3 kg in a week. MAKE SURE YOU:   Understand these instructions.  Will watch your condition.  Will get help right away if you are not doing well or get worse. Document Released: 10/24/2005 Document Revised: 04/24/2012 Document Reviewed: 06/11/2008 Exit[employer] Patient Information 2013 [employer].  

## 2013-03-25 NOTE — Progress Notes (Signed)
  Subjective:    Patient ID: [name], female    DOB: 10/04/1928, 79 y.o.   MRN: [id number]  HPI Pt here f/u edema --- daughter states legs are better.  No new complaints.    Review of Systems As above    Objective:   Physical Exam  BP 122/82  Pulse 72  Temp(Src) 98.1 F (36.7 C) (Oral)  Wt 145 lb (65.772 kg)  BMI 24.88 kg/m2  SpO2 98% General appearance: alert, cooperative, appears stated age and no distress Lungs: clear to auscultation bilaterally Heart: S1, S2 normal Extremities: tr pitting edema  b/l--- much better      Assessment & Plan:

## 2013-06-22 ENCOUNTER — Emergency Department (HOSPITAL_COMMUNITY): Payer: Medicare Other

## 2013-06-22 ENCOUNTER — Encounter (HOSPITAL_COMMUNITY): Payer: Self-pay | Admitting: Emergency Medicine

## 2013-06-22 ENCOUNTER — Emergency Department (HOSPITAL_COMMUNITY)
Admission: EM | Admit: 2013-06-22 | Discharge: 2013-06-22 | Disposition: A | Discharge status: Home / Self Care | Payer: Medicare Other | Attending: Emergency Medicine | Admitting: Emergency Medicine

## 2013-06-22 DIAGNOSIS — F0390 Unspecified dementia without behavioral disturbance: Secondary | ICD-10-CM | POA: Insufficient documentation

## 2013-06-22 DIAGNOSIS — W19XXXA Unspecified fall, initial encounter: Secondary | ICD-10-CM

## 2013-06-22 DIAGNOSIS — I10 Essential (primary) hypertension: Secondary | ICD-10-CM | POA: Insufficient documentation

## 2013-06-22 DIAGNOSIS — W1809XA Striking against other object with subsequent fall, initial encounter: Secondary | ICD-10-CM | POA: Insufficient documentation

## 2013-06-22 DIAGNOSIS — Z7982 Long term (current) use of aspirin: Secondary | ICD-10-CM | POA: Insufficient documentation

## 2013-06-22 DIAGNOSIS — S0003XA Contusion of scalp, initial encounter: Secondary | ICD-10-CM | POA: Insufficient documentation

## 2013-06-22 DIAGNOSIS — Y929 Unspecified place or not applicable: Secondary | ICD-10-CM | POA: Insufficient documentation

## 2013-06-22 DIAGNOSIS — E039 Hypothyroidism, unspecified: Secondary | ICD-10-CM | POA: Insufficient documentation

## 2013-06-22 DIAGNOSIS — Y939 Activity, unspecified: Secondary | ICD-10-CM | POA: Insufficient documentation

## 2013-06-22 DIAGNOSIS — E785 Hyperlipidemia, unspecified: Secondary | ICD-10-CM | POA: Insufficient documentation

## 2013-06-22 DIAGNOSIS — S1093XA Contusion of unspecified part of neck, initial encounter: Secondary | ICD-10-CM | POA: Insufficient documentation

## 2013-06-22 DIAGNOSIS — Z79899 Other long term (current) drug therapy: Secondary | ICD-10-CM | POA: Insufficient documentation

## 2013-06-22 DIAGNOSIS — J449 Chronic obstructive pulmonary disease, unspecified: Secondary | ICD-10-CM | POA: Insufficient documentation

## 2013-06-22 DIAGNOSIS — S0990XA Unspecified injury of head, initial encounter: Secondary | ICD-10-CM | POA: Insufficient documentation

## 2013-06-22 DIAGNOSIS — T148XXA Other injury of unspecified body region, initial encounter: Secondary | ICD-10-CM

## 2013-06-22 DIAGNOSIS — J4489 Other specified chronic obstructive pulmonary disease: Secondary | ICD-10-CM | POA: Insufficient documentation

## 2013-06-22 NOTE — ED Provider Notes (Signed)
TIME SEEN: 7:24 AM  CHIEF COMPLAINT: fall   HPI: Pt is an 79yo F with h/o dementia, HTN, HLD, hypothyroid, spinal stenosis, COPD who presents to the ED after unwitnessed fall at [facility] today.  Family reports that the patient has had 6 falls since April 2014.  They report she has a walker and they are working on getting her a wheelchair.  Nursing home staff was concerned the patient hit her head because of a small contusion to her forehead.  Family reports she is at her baseline mental status.  ROS: See HPI, unable to obtain 2/2 pt's dementia  PAST MEDICAL HISTORY/PAST SURGICAL HISTORY:  Past Medical History  Diagnosis Date  . Asthma   . Hyperlipidemia   . Hypertension   . Hypothyroidism   . Dementia   . COPD (chronic obstructive pulmonary disease)     MEDICATIONS:  Prior to Admission medications   Medication Sig Start Date End Date Taking? Authorizing Provider  albuterol (PROVENTIL) (2.5 MG/3ML) 0.083% nebulizer solution Take 2.5 mg by nebulization 3 (three) times daily.   Yes Historical Provider, MD  amLODipine (NORVASC) 5 MG tablet Take 5 mg by mouth every morning.    Yes Historical Provider, MD  aspirin EC 81 MG tablet Take 81 mg by mouth every morning.    Yes Historical Provider, MD  budesonide-formoterol (SYMBICORT) 160-4.5 MCG/ACT inhaler Inhale 1 puff into the lungs 2 (two) times daily. Gargle & spit after use   Yes Historical Provider, MD  donepezil (ARICEPT) 10 MG tablet Take 1 tablet (10 mg total) by mouth at bedtime. 03/19/13  Yes Yvonne R Lowne, DO  Emollient (EUCERIN) lotion Apply 1 mL topically 2 (two) times daily as needed for dry skin.   Yes Historical Provider, MD  furosemide (LASIX) 20 MG tablet Take 20 mg by mouth every morning.   Yes Historical Provider, MD  ipratropium (ATROVENT) 0.02 % nebulizer solution Take 500 mcg by nebulization 3 (three) times daily.   Yes Historical Provider, MD  levothyroxine (SYNTHROID, LEVOTHROID) 88 MCG tablet Take 88 mcg by mouth  daily before breakfast.   Yes Historical Provider, MD  meloxicam (MOBIC) 7.5 MG tablet Take 7.5 mg by mouth every morning. Take with food   Yes Historical Provider, MD  pravastatin (PRAVACHOL) 40 MG tablet Take 40 mg by mouth every evening.   Yes Historical Provider, MD    ALLERGIES:  No Known Allergies  SOCIAL HISTORY:  History  Substance Use Topics  . Smoking status: Former Games developer  . Smokeless tobacco: Never Used     Comment: 10 YEARS AGO AS OF 2012  . Alcohol Use: No    FAMILY HISTORY: History reviewed. No pertinent family history.  EXAM: BP 117/58  Pulse 72  Temp(Src) 98.2 F (36.8 C) (Oral)  Resp 16  SpO2 97% CONSTITUTIONAL: Alert and oriented to person only and responds appropriately to questions intermittently. Well-appearing; well-nourished; GCS 15; smiling HEAD: Normocephalic; small contusion to forehead EYES: Conjunctivae clear, PERRL, EOMI ENT: normal nose; no rhinorrhea; moist mucous membranes; pharynx without lesions noted; no dental injury; no septal hematoma NECK: Supple, no meningismus, no LAD; no midline spinal tenderness, step-off or deformity CARD: RRR; S1 and S2 appreciated; no murmurs, no clicks, no rubs, no gallops RESP: Normal chest excursion without splinting or tachypnea; breath sounds clear and equal bilaterally; no wheezes, no rhonchi, no rales; chest wall stable, nontender to palpation ABD/GI: Normal bowel sounds; non-distended; soft, non-tender, no rebound, no guarding PELVIS:  stable, nontender to palpation BACK:  The back appears normal and is non-tender to palpation, there is no CVA tenderness; no midline spinal tenderness, step-off or deformity EXT: Normal ROM in all joints; non-tender to palpation; no edema; normal capillary refill; no cyanosis    SKIN: Normal color for age and race; warm NEURO: Moves all extremities equally; follows commands intermittently; CN 2-12 intact; sensation normal PSYCH: The patient's mood and manner are appropriate.  Grooming and personal hygiene are appropriate.  MEDICAL DECISION MAKING: Pt with unwitnessed fall today at nursing home.  No obvious sign of injury on exam other than contusion to forehead.  At baseline mental status per family.  Family would like imaging today to rule out any injury but are not interested in work up to establish if any organic causes for fall such as infection, anemia, electrolyte abnormality, etc.  Will Ct head and cervical spine.  Pt is on ASA 81mg  daily.  No other anticoagulation.   ED PROGRESS:  8:35 AM  Pt's imaging shows no acute changes. Patient is still well-appearing, at baseline, no focal neurologic deficits, hemodynamically stable. We'll discharge her back to [facility].  Given family customary in usual return precautions. Family verbalized understanding and are comfortable with this plan.  Layla Maw Ward, DO 06/22/13 [phone]

## 2013-06-22 NOTE — ED Notes (Signed)
Per EMS, pt from [facility], had a witnessed fall, hit her head.  Abrasion noted to R eyebrow, c/o pain upon palpation.  Hx dementia, at baseline per staff. Not currently taking any blood thinners, no LOC.

## 2013-06-22 NOTE — ED Notes (Signed)
Pt returned from CT °

## 2013-06-22 NOTE — ED Notes (Signed)
Bed: WU98 Expected date: 06/22/13 Expected time: 6:04 AM Means of arrival: Ambulance Comments: Fall, shoulder injury

## 2013-06-22 NOTE — ED Notes (Signed)
Patient transported to CT 

## 2013-07-05 ENCOUNTER — Telehealth: Payer: Self-pay | Admitting: Family Medicine

## 2013-07-05 DIAGNOSIS — F0280 Dementia in other diseases classified elsewhere without behavioral disturbance: Secondary | ICD-10-CM

## 2013-07-05 DIAGNOSIS — W19XXXA Unspecified fall, initial encounter: Secondary | ICD-10-CM

## 2013-07-05 DIAGNOSIS — R29898 Other symptoms and signs involving the musculoskeletal system: Secondary | ICD-10-CM

## 2013-07-05 NOTE — Telephone Encounter (Signed)
msg left to call the office     KP 

## 2013-07-05 NOTE — Telephone Encounter (Signed)
Pt daughter called to see what process do they need to go through to get her mother a wheel chair. Please call patient daugther with her next step. thanks

## 2013-07-10 IMAGING — CR DG CHEST 2V
2 series · 2 of 2 positions shown · non-contrast
Comparison: 12/06/2010

CLINICAL DATA: Cough.  Pneumonia.  Demented.  Question urinary
tract infection.  Nonsmoker with COPD.

CHEST - 2 VIEW

[w chest lat]
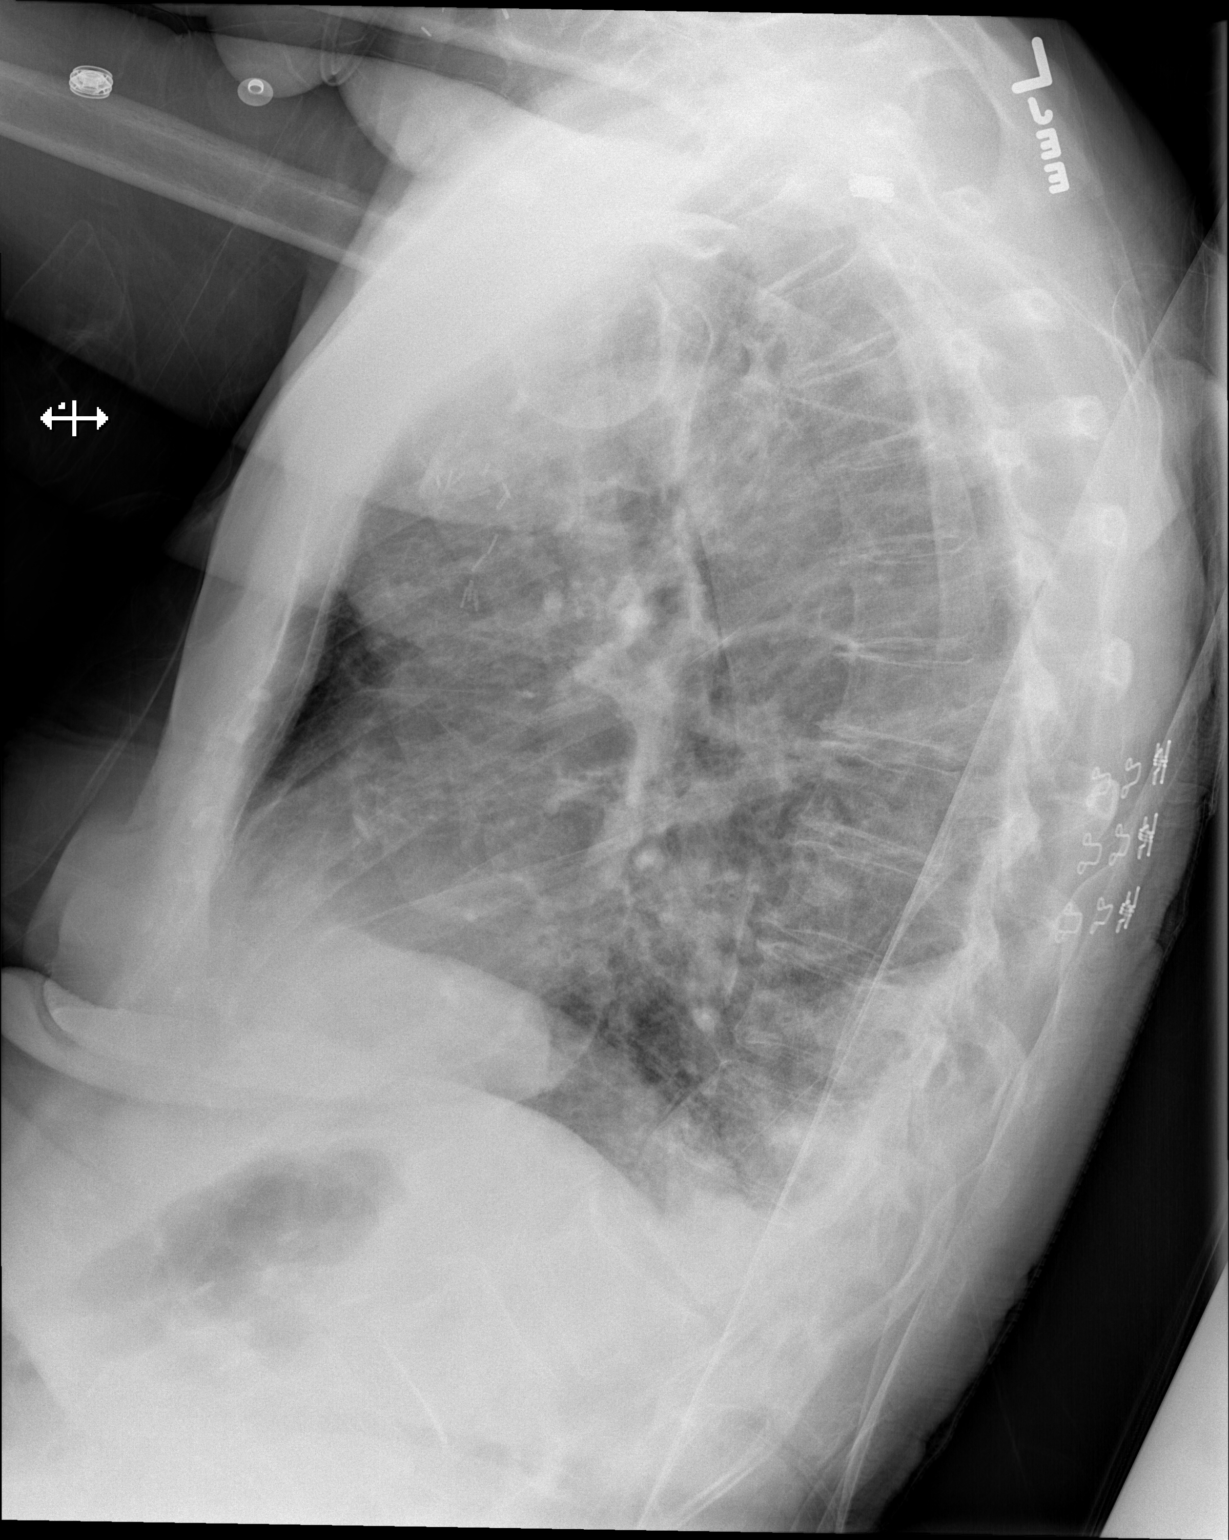

[x chest ap]
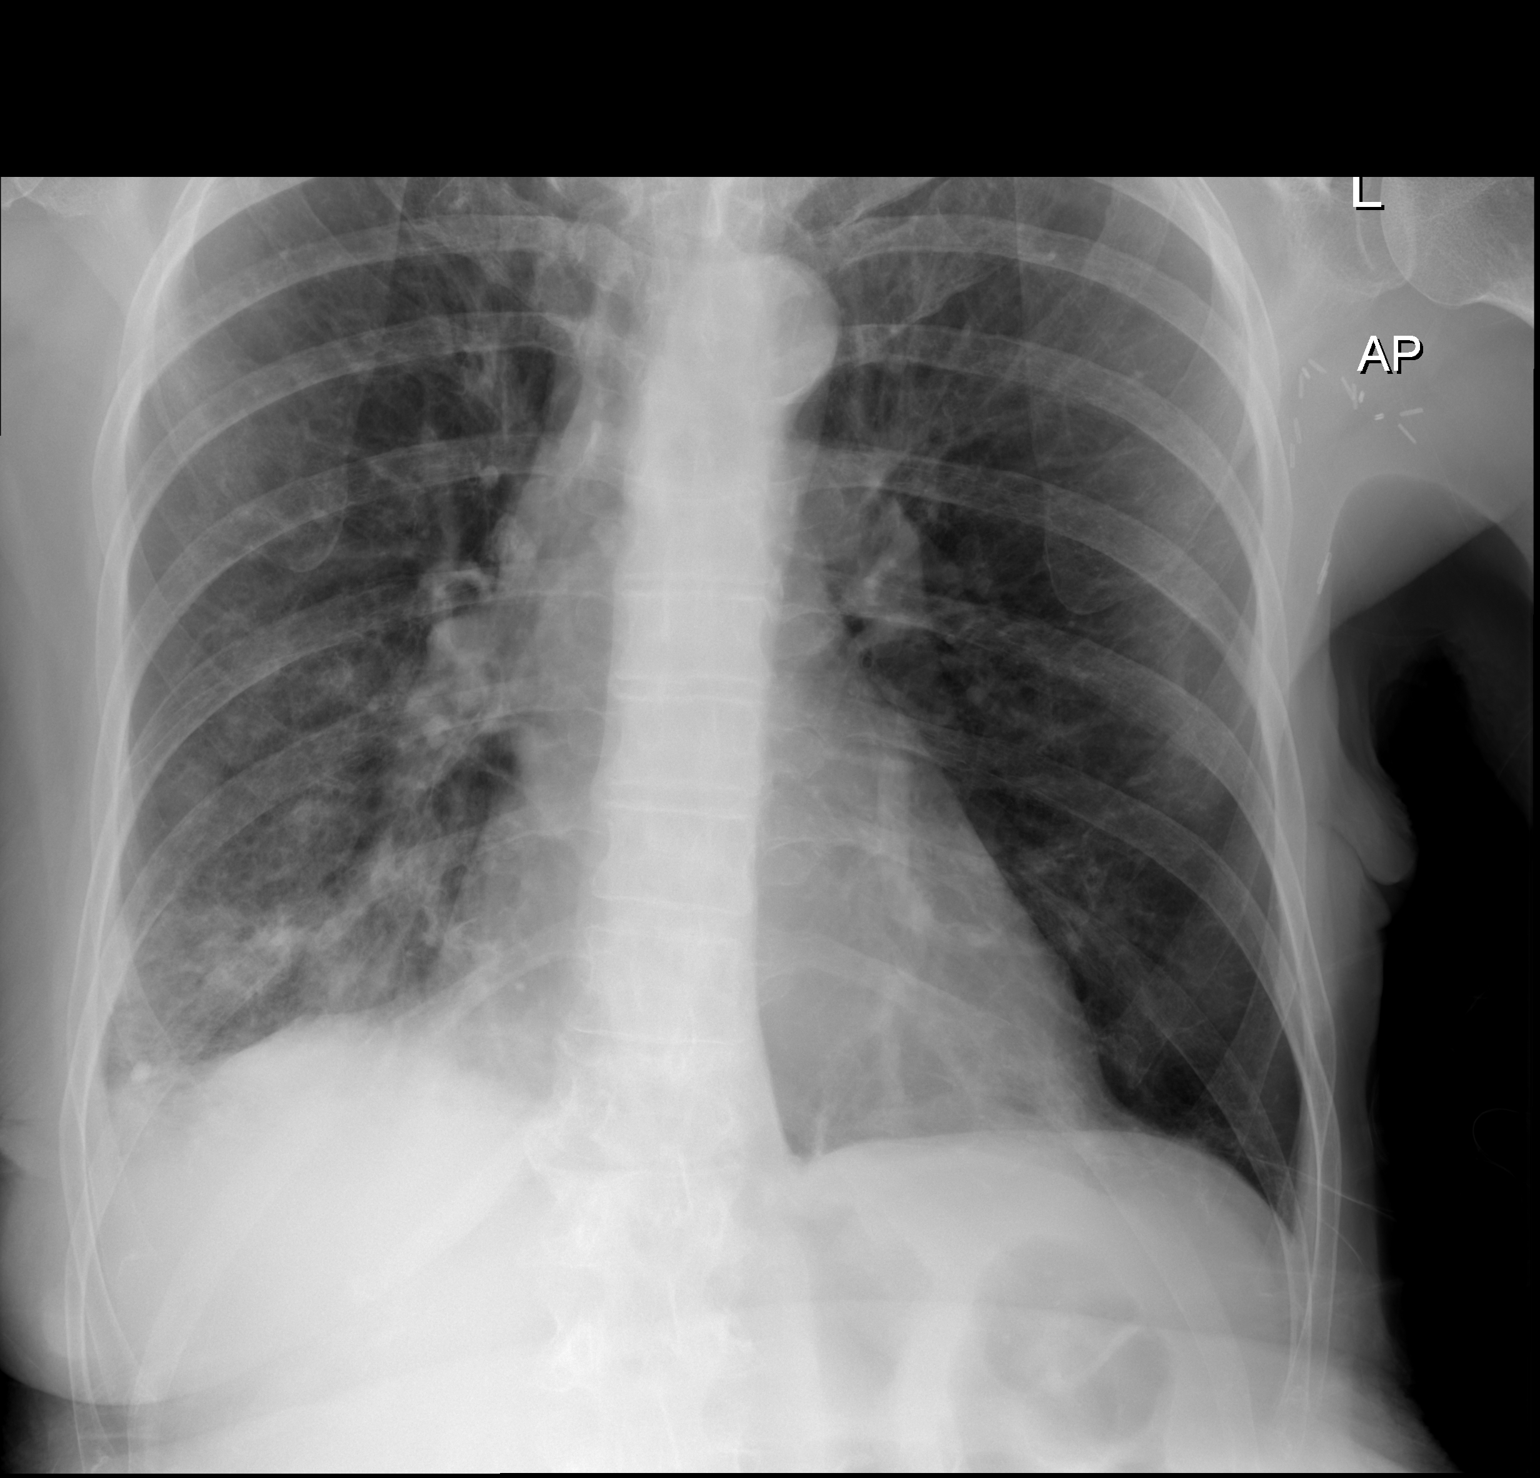

[2 of 2 positions shown; findings below may reference images not displayed]

FINDINGS: Lateral view degraded by patient positioning.
Hyperinflation.  Lower thoracic compression deformities,
suboptimally evaluated.

Surgical clips in the left axilla.  Probable left mastectomy.
Midline trachea.  Mild cardiomegaly with atherosclerosis in the
transverse aorta.  Probable small right pleural effusion, new. No
pneumothorax.  Scarring at the left lung base.  Patchy airspace
disease within the right lower and likely right upper lobes.
Suggestion of reticular nodular component in the right upper lobe.
IMPRESSION: Right-sided airspace disease, suspicious for infection or
aspiration.  New since 12/06/2010. Recommend radiographic follow-up
until clearing.

Probable small right pleural effusion.

## 2013-07-17 NOTE — Telephone Encounter (Signed)
Spoke with Harriett Sine and she stated that the patient was having leg weakness and increased falls. Hx of Alzheimer's. Please advise if we can order the wheel chair and I will place the order.     KP

## 2013-07-17 NOTE — Telephone Encounter (Signed)
Ok to  Order wheelchair---- neuro referral  Dx inc falls

## 2013-07-18 NOTE — Telephone Encounter (Signed)
Order sent to [facility].       KP

## 2013-08-05 ENCOUNTER — Encounter: Payer: Self-pay | Admitting: Neurology

## 2013-08-16 ENCOUNTER — Encounter: Payer: Self-pay | Admitting: Neurology

## 2013-08-16 ENCOUNTER — Encounter (INDEPENDENT_AMBULATORY_CARE_PROVIDER_SITE_OTHER): Payer: Self-pay

## 2013-08-16 ENCOUNTER — Ambulatory Visit (INDEPENDENT_AMBULATORY_CARE_PROVIDER_SITE_OTHER): Payer: Medicare Other | Admitting: Neurology

## 2013-08-16 ENCOUNTER — Ambulatory Visit: Payer: Self-pay | Admitting: Neurology

## 2013-08-16 VITALS — BP 118/74 | HR 93

## 2013-08-16 DIAGNOSIS — F068 Other specified mental disorders due to known physiological condition: Secondary | ICD-10-CM

## 2013-08-16 NOTE — Progress Notes (Signed)
Reason for visit: Memory disorder, Alzheimer's disease  [name] is an 79 y.o. female  History of present illness:  [name] is an 79 year old right-handed white female with a history of a progressive memory disorder consistent with Alzheimer's disease. The patient has continued to progress. The patient is on Aricept and Namenda, and she has a gait disorder. The patient will fall on occasion, and she has recently gotten a wheelchair. The family indicates that she does not try to get up out of a chair or out of bed on her own. The patient will collapse while standing. The patient is eating and drinking fairly well. The patient is incontinent of bowel and bladder. The patient requires assistance with bathing, dressing, and occasionally with feeding. The patient will occasionally hold food in her mouth, but she is able to take her medications fairly well. The patient has recently gotten over an episode of severe cellulitis. The patient fell in August of 2014, and bumped her head. The patient went to the emergency room, and a CT scan of the brain, and a CT of the cervical spine was done. These studies were unremarkable.  Past Medical History  Diagnosis Date  . Asthma   . Hyperlipidemia   . Hypertension   . Hypothyroidism   . Dementia   . COPD (chronic obstructive pulmonary disease)   . Alzheimer disease   . Breast cancer   . Migraine   . Vitamin D deficiency disease   . History of gallstones   . History of COPD   . Lumbosacral stenosis     Past Surgical History  Procedure Laterality Date  . Carotid endarterectomy    . Cataract extraction    . Abdominal hysterectomy    . Breast lumpectomy    . Mastectomy      Left, 07/15/2002  . Back surgery      Family History  Problem Relation Age of Onset  . Heart attack Father   . Dementia Sister   . Heart attack Brother   . Dementia Sister     Social history:  reports that she has quit smoking. She has never used smokeless  tobacco. She reports that she does not drink alcohol or use illicit drugs.   No Known Allergies  Medications:  Current Outpatient Prescriptions on File Prior to Visit  Medication Sig Dispense Refill  . albuterol (PROVENTIL) (2.5 MG/3ML) 0.083% nebulizer solution Take 2.5 mg by nebulization 3 (three) times daily.      Marland Kitchen amLODipine (NORVASC) 5 MG tablet Take 5 mg by mouth every morning.       Marland Kitchen aspirin EC 81 MG tablet Take 81 mg by mouth every morning.       . budesonide-formoterol (SYMBICORT) 160-4.5 MCG/ACT inhaler Inhale 1 puff into the lungs 2 (two) times daily. Gargle & spit after use      . Emollient (EUCERIN) lotion Apply 1 mL topically 2 (two) times daily as needed for dry skin.      . furosemide (LASIX) 20 MG tablet Take 20 mg by mouth every morning.      Marland Kitchen ipratropium (ATROVENT) 0.02 % nebulizer solution Take 500 mcg by nebulization 3 (three) times daily.      Marland Kitchen levothyroxine (SYNTHROID, LEVOTHROID) 88 MCG tablet Take 88 mcg by mouth daily before breakfast.      . meloxicam (MOBIC) 7.5 MG tablet Take 7.5 mg by mouth every morning. Take with food      . pravastatin (PRAVACHOL) 40 MG  tablet Take 40 mg by mouth every evening.       No current facility-administered medications on file prior to visit.    ROS:  Out of a complete 14 system review of symptoms, the patient complains only of the following symptoms, and all other reviewed systems are negative.  Fatigue Shortness of breath, cough, wheezing Urinary incontinence Easy bruising Allergies, frequent infections  Memory loss, confusion, weakness, gait disorder Sundowning  Blood pressure 118/74, pulse 93, weight 0 lb (0 kg).  Blood pressure, right arm, standing is 122/80.  Physical Exam  General: The patient is alert and cooperative at the time of the examination.  Skin: 2-3+ edema below the knees is noted bilaterally.   Neurologic Exam  Mental status: Mini-Mental status examination done today shows a total score of  3 out of 30.  Cranial nerves: Facial symmetry is present. Speech is associated with a significant dysnomia. No dysarthria is noted.  Extraocular movements are full. Visual fields are full.  Motor: The patient has good strength in all 4 extremities.  Coordination: The patient has good finger-nose-finger and heel-to-shin bilaterally.The patient has significant apraxia with the use of the extremities.  Gait and station: The patient has a wide-based, unsteady gait. The patient requires assistance for ambulation. Tandem gait was not attempted.  Reflexes: Deep tendon reflexes are symmetric, but are depressed.   Assessment/Plan:  One. Alzheimer's disease  2. Gait disorder  The patient has continued to decline with her mental status. At this point, the Namenda will be stopped, and the Aricept will be reduced to a 5 mg dose at night. If the patient does well for 2 months, the Aricept could be discontinued. The patient will followup through this office if needed.  Marlan Palau MD 08/16/2013 2:15 PM  [facility] [address] [place], [place] [address]  Phone [phone] Fax [phone]

## 2013-08-16 NOTE — Patient Instructions (Signed)
If she does well on the 5 mg dose of aricept for 2 months, may be able to discontinue the medication altogether.

## 2013-09-18 ENCOUNTER — Encounter: Payer: Self-pay | Admitting: Cardiology

## 2013-12-21 ENCOUNTER — Emergency Department (HOSPITAL_COMMUNITY): Payer: Medicare Other

## 2013-12-21 ENCOUNTER — Emergency Department (HOSPITAL_COMMUNITY)
Admission: EM | Admit: 2013-12-21 | Discharge: 2013-12-21 | Disposition: A | Discharge status: Skilled Nursing Facility | Payer: Medicare Other | Attending: Emergency Medicine | Admitting: Emergency Medicine

## 2013-12-21 ENCOUNTER — Encounter (HOSPITAL_COMMUNITY): Payer: Self-pay | Admitting: Emergency Medicine

## 2013-12-21 DIAGNOSIS — Z87891 Personal history of nicotine dependence: Secondary | ICD-10-CM | POA: Insufficient documentation

## 2013-12-21 DIAGNOSIS — Z7982 Long term (current) use of aspirin: Secondary | ICD-10-CM | POA: Insufficient documentation

## 2013-12-21 DIAGNOSIS — J449 Chronic obstructive pulmonary disease, unspecified: Secondary | ICD-10-CM | POA: Insufficient documentation

## 2013-12-21 DIAGNOSIS — Z853 Personal history of malignant neoplasm of breast: Secondary | ICD-10-CM | POA: Insufficient documentation

## 2013-12-21 DIAGNOSIS — J4489 Other specified chronic obstructive pulmonary disease: Secondary | ICD-10-CM | POA: Insufficient documentation

## 2013-12-21 DIAGNOSIS — R296 Repeated falls: Secondary | ICD-10-CM | POA: Insufficient documentation

## 2013-12-21 DIAGNOSIS — E785 Hyperlipidemia, unspecified: Secondary | ICD-10-CM | POA: Insufficient documentation

## 2013-12-21 DIAGNOSIS — E039 Hypothyroidism, unspecified: Secondary | ICD-10-CM | POA: Insufficient documentation

## 2013-12-21 DIAGNOSIS — S0101XA Laceration without foreign body of scalp, initial encounter: Secondary | ICD-10-CM

## 2013-12-21 DIAGNOSIS — Y939 Activity, unspecified: Secondary | ICD-10-CM | POA: Insufficient documentation

## 2013-12-21 DIAGNOSIS — S0100XA Unspecified open wound of scalp, initial encounter: Secondary | ICD-10-CM | POA: Insufficient documentation

## 2013-12-21 DIAGNOSIS — Y929 Unspecified place or not applicable: Secondary | ICD-10-CM | POA: Insufficient documentation

## 2013-12-21 DIAGNOSIS — Z8739 Personal history of other diseases of the musculoskeletal system and connective tissue: Secondary | ICD-10-CM | POA: Insufficient documentation

## 2013-12-21 DIAGNOSIS — Z79899 Other long term (current) drug therapy: Secondary | ICD-10-CM | POA: Insufficient documentation

## 2013-12-21 DIAGNOSIS — F0280 Dementia in other diseases classified elsewhere without behavioral disturbance: Secondary | ICD-10-CM | POA: Insufficient documentation

## 2013-12-21 DIAGNOSIS — G309 Alzheimer's disease, unspecified: Secondary | ICD-10-CM | POA: Insufficient documentation

## 2013-12-21 DIAGNOSIS — G43909 Migraine, unspecified, not intractable, without status migrainosus: Secondary | ICD-10-CM | POA: Insufficient documentation

## 2013-12-21 DIAGNOSIS — I10 Essential (primary) hypertension: Secondary | ICD-10-CM | POA: Insufficient documentation

## 2013-12-21 NOTE — ED Notes (Signed)
Per EMS , pt. From [place], reported of unwitnessed fall , pt. Was found on the floor on her room @ 5am, with laceration on the posterior head, bleeding controlled, pt. Has dementia , checked off for back and spine injury, none reported.

## 2013-12-21 NOTE — ED Notes (Signed)
Bed: QA83 Expected date:  Expected time:  Means of arrival:  Comments: EMS 80yo F, fall, head lac

## 2013-12-21 NOTE — ED Provider Notes (Signed)
CSN: [id number]     Arrival date & time 12/21/13  0600 History   First MD Initiated Contact with Patient 12/21/13 [phone]     Chief Complaint  Patient presents with  . Fall  . Head Injury     (Consider location/radiation/quality/duration/timing/severity/associated sxs/prior Treatment) Patient is a 80 y.o. female presenting with fall and head injury. The history is provided by the patient. No language interpreter was used.  Fall This is a new problem. The current episode started today. Associated symptoms comments: Per EMS and nursing home report, the patient was found on the floor next to her bed after unwitnessed fall causing posterior scalp laceration. No report of vomiting. The patient has a history of dementia.Marland Kitchen  Head Injury   Past Medical History  Diagnosis Date  . Asthma   . Hyperlipidemia   . Hypertension   . Hypothyroidism   . Dementia   . COPD (chronic obstructive pulmonary disease)   . Alzheimer disease   . Breast cancer   . Migraine   . Vitamin D deficiency disease   . History of gallstones   . History of COPD   . Lumbosacral stenosis    Past Surgical History  Procedure Laterality Date  . Carotid endarterectomy    . Cataract extraction    . Abdominal hysterectomy    . Breast lumpectomy    . Mastectomy      Left, 07/15/2002  . Back surgery     Family History  Problem Relation Age of Onset  . Heart attack Father   . Dementia Sister   . Heart attack Brother   . Dementia Sister    History  Substance Use Topics  . Smoking status: Former Research scientist (life sciences)  . Smokeless tobacco: Never Used     Comment: 10 YEARS AGO AS OF 2012  . Alcohol Use: No   OB History   Grav Para Term Preterm Abortions TAB SAB Ect Mult Living                 Review of Systems  Unable to perform ROS: Dementia      Allergies  Review of patient's allergies indicates no known allergies.  Home Medications   Current Outpatient Rx  Name  Route  Sig  Dispense  Refill  . albuterol  (PROVENTIL HFA;VENTOLIN HFA) 108 (90 BASE) MCG/ACT inhaler   Inhalation   Inhale 2 puffs into the lungs every 6 (six) hours as needed for wheezing.         Marland Kitchen albuterol (PROVENTIL) (2.5 MG/3ML) 0.083% nebulizer solution   Nebulization   Take 2.5 mg by nebulization 3 (three) times daily.         Marland Kitchen ALPRAZolam (XANAX) 0.5 MG tablet   Oral   Take 0.5 mg by mouth 3 (three) times daily as needed for sleep.         Marland Kitchen amLODipine (NORVASC) 5 MG tablet   Oral   Take 5 mg by mouth every morning.          Marland Kitchen aspirin EC 81 MG tablet   Oral   Take 81 mg by mouth every morning.          . budesonide-formoterol (SYMBICORT) 160-4.5 MCG/ACT inhaler   Inhalation   Inhale 1 puff into the lungs 2 (two) times daily. Gargle & spit after use         . donepezil (ARICEPT) 5 MG tablet   Oral   Take 5 mg by mouth at bedtime.         Marland Kitchen  Emollient (EUCERIN) lotion   Topical   Apply 1 mL topically 2 (two) times daily as needed for dry skin.         Marland Kitchen. guaiFENesin (MUCINEX) 600 MG 12 hr tablet   Oral   Take 1,200 mg by mouth 2 (two) times daily as needed for congestion.         Marland Kitchen. ipratropium (ATROVENT) 0.02 % nebulizer solution   Nebulization   Take 500 mcg by nebulization 3 (three) times daily.         Marland Kitchen. levothyroxine (SYNTHROID, LEVOTHROID) 88 MCG tablet   Oral   Take 88 mcg by mouth daily before breakfast.         . meloxicam (MOBIC) 7.5 MG tablet   Oral   Take 7.5 mg by mouth every morning. Take with food         . ondansetron (ZOFRAN) 4 MG tablet   Oral   Take 4 mg by mouth every 8 (eight) hours as needed for nausea.         . pravastatin (PRAVACHOL) 40 MG tablet   Oral   Take 40 mg by mouth every evening.         Marland Kitchen. SPIRIVA HANDIHALER 18 MCG inhalation capsule   Inhalation   Place 18 mcg into inhaler and inhale daily.         . traMADol (ULTRAM) 50 MG tablet   Oral   Take 50 mg by mouth every 6 (six) hours as needed for moderate pain.          .  meclizine (ANTIVERT) 25 MG tablet   Oral   Take 25 mg by mouth 3 (three) times daily as needed.          BP 140/96  Pulse 87  Temp(Src) 98.9 F (37.2 C) (Axillary)  Resp 18  SpO2 97% Physical Exam  Constitutional: She is oriented to person, place, and time. She appears well-developed and well-nourished. No distress.  HENT:  Head: Normocephalic.  Eyes: Conjunctivae are normal.  Neck: Normal range of motion.  Pulmonary/Chest: Effort normal.  Abdominal: Soft. There is no tenderness.  Neurological: She is alert and oriented to person, place, and time.  The patient is non-verbal, does not follow command. She moves all extremities.  Skin: Skin is warm and dry.  3 cm through-and-through lac left occipital scalp. No hematoma.     ED Course  Procedures (including critical care time) Labs Review Labs Reviewed - No data to display Imaging Review No results found.  EKG Interpretation   None      LACERATION REPAIR Performed by: Elpidio AnisUPSTILL, Zamora Colton A Authorized by: Elpidio AnisUPSTILL, Deaglan Lile A Consent: Verbal consent obtained. Risks and benefits: risks, benefits and alternatives were discussed Consent given by: patient Patient identity confirmed: provided demographic data Prepped and Draped in normal sterile fashion Wound explored  Laceration Location: occipital scalp  Laceration Length: 3cm  No Foreign Bodies seen or palpated  Anesthesia: local infiltration  Local anesthetic: lidocaine 2% w/ epinephrine  Anesthetic total: 1 ml  Irrigation method: syringe Amount of cleaning: standard  Skin closure: staples  Number of sutures: 3  Technique: staples  Patient tolerance: Patient tolerated the procedure well with no immediate complications.  MDM   Final diagnoses:  None    1. scalp laceration  Chart reviewed. Per notes, the patient has progressive dementia, is unsteady to walk and remains bed or wheelchair bound, does not usually attempt to get up. She appears at baseline  given this  limited provided history.     Dewaine Oats, PA-C 12/21/13 [phone]

## 2013-12-21 NOTE — ED Notes (Signed)
ptar called 

## 2013-12-21 NOTE — Discharge Instructions (Signed)
Staple Wound Closure Staples are used to help a wound heal faster by holding the edges of the wound together. HOME CARE  Keep the area around the staples clean and dry.  Rest and raise (elevate) the injured part above the level of your heart.  See your doctor for a follow-up check of the wound.  See your doctor to have the staples removed.  Clean the wound daily with water.  Do not soak the wound in water for long periods of time.  Let air reach the wound as it heals. GET HELP RIGHT AWAY IF:   You have redness or puffiness around the wound.  You have a red line going away from the wound.  You have more pain or tenderness.  You have yellowish-white fluid (pus) coming from the wound.  Your wound does not stay together after the staples have been taken out.  You see something coming out of the wound, such as wood or glass.  You have problems moving the injured area.  You have a fever or lasting symptoms for more than 2-3 days.  You have a fever and your symptoms suddenly get worse. MAKE SURE YOU:   Understand these instructions.  Will watch this condition.  Will get help right away if you are not doing well or get worse. Document Released: 08/02/2008 Document Revised: 07/18/2012 Document Reviewed: 05/06/2012 [facility] Patient Information 2014 [place].

## 2013-12-23 NOTE — ED Provider Notes (Signed)
Medical screening examination/treatment/procedure(s) were conducted as a shared visit with non-physician practitioner(s) and myself.  I personally evaluated the patient during the encounter.  EMS called for mechanical fall at [facility]. PT with dementia, does not provide history, reported baseline. On exam has head lac, PERRL, MAE x 4. Evaluated with CT scans, MLP repaired head wound. Appropriate for discharge back to [facility]  Teressa Lower, MD 12/23/13 [phone]

## 2014-01-01 ENCOUNTER — Encounter: Payer: Self-pay | Admitting: Family Medicine

## 2014-01-01 ENCOUNTER — Ambulatory Visit (INDEPENDENT_AMBULATORY_CARE_PROVIDER_SITE_OTHER): Payer: Medicare Other | Admitting: Family Medicine

## 2014-01-01 VITALS — BP 116/64 | HR 72 | Temp 97.0°F | Resp 18

## 2014-01-01 DIAGNOSIS — S0190XA Unspecified open wound of unspecified part of head, initial encounter: Secondary | ICD-10-CM

## 2014-01-01 DIAGNOSIS — S0191XA Laceration without foreign body of unspecified part of head, initial encounter: Secondary | ICD-10-CM

## 2014-01-01 NOTE — Progress Notes (Signed)
Pre visit review using our clinic review tool, if applicable. No additional management support is needed unless otherwise documented below in the visit note. 

## 2014-01-01 NOTE — Progress Notes (Signed)
Patient ID: [name], female   DOB: April 14, 1928, 80 y.o.   MRN: [id number]   Subjective:    Patient ID: [name], female    DOB: 1928-09-30, 80 y.o.   MRN: [id number] HPI Pt here with daughter and son f/u ER from fall at [facility]. Pt had 3 staples put in and is here to have them taken out.           Objective:    BP 116/64  Pulse 72  Temp(Src) 97 F (36.1 C) (Tympanic)  Resp 18  SpO2 97% General appearance: cooperative Scalp--  Laceration back of head with 3 staples.  Area cleaned with betadine and 3 staples removed with no complications.       Assessment & Plan:  1. Laceration of head Staples removed with no complications rto as previously scheduled

## 2014-01-01 NOTE — Assessment & Plan Note (Signed)
Staples x3 removed with no complications

## 2017-01-05 DEATH — deceased
